# Patient Record
Sex: Female | Born: 1955 | Race: Black or African American | Hispanic: No | Smoking: Never smoker
Health system: Southern US, Community
[De-identification: ages and names within clinical notes are randomized; demographics above are authoritative.]

## PROBLEM LIST (undated history)

## (undated) DIAGNOSIS — S82899A Other fracture of unspecified lower leg, initial encounter for closed fracture: Secondary | ICD-10-CM

## (undated) HISTORY — PX: ABSCESS DRAINAGE: SHX1119

---

## 2003-10-24 ENCOUNTER — Other Ambulatory Visit: Admission: RE | Admit: 2003-10-24 | Discharge: 2003-10-24 | Payer: Self-pay | Admitting: Obstetrics and Gynecology

## 2004-10-24 ENCOUNTER — Other Ambulatory Visit: Admission: RE | Admit: 2004-10-24 | Discharge: 2004-10-24 | Payer: Self-pay | Admitting: Obstetrics and Gynecology

## 2004-11-19 ENCOUNTER — Other Ambulatory Visit (HOSPITAL_COMMUNITY): Admission: RE | Admit: 2004-11-19 | Discharge: 2005-02-17 | Payer: Self-pay | Admitting: Psychiatry

## 2004-11-19 ENCOUNTER — Ambulatory Visit: Payer: Self-pay | Admitting: Psychiatry

## 2006-10-08 ENCOUNTER — Emergency Department (HOSPITAL_COMMUNITY): Admission: EM | Admit: 2006-10-08 | Discharge: 2006-10-08 | Payer: Self-pay | Admitting: Family Medicine

## 2018-09-26 ENCOUNTER — Encounter (HOSPITAL_COMMUNITY): Payer: Self-pay

## 2018-09-26 ENCOUNTER — Encounter (HOSPITAL_COMMUNITY): Payer: Self-pay | Admitting: Emergency Medicine

## 2018-09-26 ENCOUNTER — Ambulatory Visit (INDEPENDENT_AMBULATORY_CARE_PROVIDER_SITE_OTHER): Payer: BLUE CROSS/BLUE SHIELD

## 2018-09-26 ENCOUNTER — Encounter (HOSPITAL_COMMUNITY)
Admission: AD | Disposition: A | Discharge status: Home / Self Care | Payer: Self-pay | Source: Ambulatory Visit | Attending: Orthopedic Surgery

## 2018-09-26 ENCOUNTER — Observation Stay (HOSPITAL_COMMUNITY)
Admission: AD | Admit: 2018-09-26 | Discharge: 2018-09-27 | Disposition: A | Discharge status: Home / Self Care | Payer: BLUE CROSS/BLUE SHIELD | Source: Ambulatory Visit | Attending: Orthopedic Surgery | Admitting: Orthopedic Surgery

## 2018-09-26 ENCOUNTER — Ambulatory Visit (HOSPITAL_COMMUNITY)
Admission: EM | Admit: 2018-09-26 | Discharge: 2018-09-26 | Disposition: A | Discharge status: Home / Self Care | Payer: BLUE CROSS/BLUE SHIELD | Source: Home / Self Care

## 2018-09-26 ENCOUNTER — Other Ambulatory Visit: Payer: Self-pay

## 2018-09-26 ENCOUNTER — Observation Stay (HOSPITAL_COMMUNITY): Payer: BLUE CROSS/BLUE SHIELD | Admitting: Anesthesiology

## 2018-09-26 ENCOUNTER — Observation Stay (HOSPITAL_COMMUNITY): Payer: BLUE CROSS/BLUE SHIELD

## 2018-09-26 DIAGNOSIS — S82842A Displaced bimalleolar fracture of left lower leg, initial encounter for closed fracture: Secondary | ICD-10-CM

## 2018-09-26 DIAGNOSIS — S99919A Unspecified injury of unspecified ankle, initial encounter: Secondary | ICD-10-CM | POA: Diagnosis present

## 2018-09-26 DIAGNOSIS — Y92003 Bedroom of unspecified non-institutional (private) residence as the place of occurrence of the external cause: Secondary | ICD-10-CM | POA: Diagnosis not present

## 2018-09-26 DIAGNOSIS — W01198A Fall on same level from slipping, tripping and stumbling with subsequent striking against other object, initial encounter: Secondary | ICD-10-CM | POA: Diagnosis not present

## 2018-09-26 DIAGNOSIS — S82843A Displaced bimalleolar fracture of unspecified lower leg, initial encounter for closed fracture: Secondary | ICD-10-CM

## 2018-09-26 DIAGNOSIS — Z1159 Encounter for screening for other viral diseases: Secondary | ICD-10-CM | POA: Diagnosis not present

## 2018-09-26 DIAGNOSIS — Z419 Encounter for procedure for purposes other than remedying health state, unspecified: Secondary | ICD-10-CM

## 2018-09-26 HISTORY — PX: ORIF ANKLE FRACTURE: SHX5408

## 2018-09-26 HISTORY — DX: Displaced bimalleolar fracture of unspecified lower leg, initial encounter for closed fracture: S82.843A

## 2018-09-26 HISTORY — DX: Other fracture of unspecified lower leg, initial encounter for closed fracture: S82.899A

## 2018-09-26 LAB — SURGICAL PCR SCREEN
MRSA, PCR: NEGATIVE
Staphylococcus aureus: NEGATIVE

## 2018-09-26 LAB — SARS CORONAVIRUS 2 BY RT PCR (HOSPITAL ORDER, PERFORMED IN ~~LOC~~ HOSPITAL LAB): SARS Coronavirus 2: NEGATIVE

## 2018-09-26 SURGERY — OPEN REDUCTION INTERNAL FIXATION (ORIF) ANKLE FRACTURE
Anesthesia: General | Site: Ankle | Laterality: Left

## 2018-09-26 MED ORDER — FENTANYL CITRATE (PF) 100 MCG/2ML IJ SOLN
INTRAMUSCULAR | Status: AC
  Administered 2018-09-26: 17:00:00 100 ug via INTRAVENOUS
  Filled 2018-09-26: qty 2

## 2018-09-26 MED ORDER — ONDANSETRON HCL 4 MG PO TABS: 4.0000 mg | ORAL_TABLET | Freq: Four times a day (QID) | ORAL | Status: DC | PRN

## 2018-09-26 MED ORDER — MIDAZOLAM HCL 2 MG/2ML IJ SOLN
INTRAMUSCULAR | Status: AC
  Administered 2018-09-26: 17:00:00 2 mg via INTRAVENOUS
  Filled 2018-09-26: qty 2

## 2018-09-26 MED ORDER — METHOCARBAMOL 1000 MG/10ML IJ SOLN
500.0000 mg | Freq: Four times a day (QID) | INTRAVENOUS | Status: DC | PRN
  Filled 2018-09-26: qty 5

## 2018-09-26 MED ORDER — METOCLOPRAMIDE HCL 5 MG PO TABS: 5.0000 mg | ORAL_TABLET | Freq: Three times a day (TID) | ORAL | Status: DC | PRN

## 2018-09-26 MED ORDER — FENTANYL CITRATE (PF) 250 MCG/5ML IJ SOLN
INTRAMUSCULAR | Status: AC
  Filled 2018-09-26: qty 5

## 2018-09-26 MED ORDER — METHOCARBAMOL 500 MG PO TABS
500.0000 mg | ORAL_TABLET | Freq: Four times a day (QID) | ORAL | Status: DC | PRN
  Administered 2018-09-26: 20:00:00 500 mg via ORAL

## 2018-09-26 MED ORDER — OXYCODONE HCL 5 MG PO TABS
5.0000 mg | ORAL_TABLET | ORAL | Status: DC | PRN
  Administered 2018-09-26: 23:00:00 10 mg via ORAL
  Filled 2018-09-26: qty 2

## 2018-09-26 MED ORDER — FENTANYL CITRATE (PF) 100 MCG/2ML IJ SOLN
INTRAMUSCULAR | Status: AC
  Administered 2018-09-26: 20:00:00 25 ug via INTRAVENOUS
  Filled 2018-09-26: qty 2

## 2018-09-26 MED ORDER — 0.9 % SODIUM CHLORIDE (POUR BTL) OPTIME
TOPICAL | Status: DC | PRN
  Administered 2018-09-26: 18:00:00 1000 mL

## 2018-09-26 MED ORDER — CEFAZOLIN SODIUM-DEXTROSE 2-4 GM/100ML-% IV SOLN
2.0000 g | INTRAVENOUS | Status: AC
  Administered 2018-09-26: 2 g via INTRAVENOUS
  Filled 2018-09-26 (×2): qty 100

## 2018-09-26 MED ORDER — LIDOCAINE 2% (20 MG/ML) 5 ML SYRINGE
INTRAMUSCULAR | Status: DC | PRN
  Administered 2018-09-26: 100 mg via INTRAVENOUS

## 2018-09-26 MED ORDER — ONDANSETRON HCL 4 MG/2ML IJ SOLN
INTRAMUSCULAR | Status: DC | PRN
  Administered 2018-09-26: 4 mg via INTRAVENOUS

## 2018-09-26 MED ORDER — CEFAZOLIN SODIUM-DEXTROSE 2-4 GM/100ML-% IV SOLN
2.0000 g | Freq: Four times a day (QID) | INTRAVENOUS | Status: AC
  Administered 2018-09-26 – 2018-09-27 (×2): 2 g via INTRAVENOUS
  Filled 2018-09-26 (×2): qty 100

## 2018-09-26 MED ORDER — LACTATED RINGERS IV SOLN
INTRAVENOUS | Status: AC
  Administered 2018-09-26: 22:00:00 via INTRAVENOUS

## 2018-09-26 MED ORDER — MEPERIDINE HCL 25 MG/ML IJ SOLN: 6.2500 mg | INTRAMUSCULAR | Status: DC | PRN

## 2018-09-26 MED ORDER — METHOCARBAMOL 500 MG PO TABS
ORAL_TABLET | ORAL | Status: AC
  Administered 2018-09-26: 20:00:00 500 mg via ORAL
  Filled 2018-09-26: qty 1

## 2018-09-26 MED ORDER — PROPOFOL 10 MG/ML IV BOLUS
INTRAVENOUS | Status: AC
  Filled 2018-09-26: qty 20

## 2018-09-26 MED ORDER — ASPIRIN 81 MG PO CHEW
81.0000 mg | CHEWABLE_TABLET | Freq: Two times a day (BID) | ORAL | Status: DC
  Administered 2018-09-27: 09:00:00 81 mg via ORAL
  Filled 2018-09-26: qty 1

## 2018-09-26 MED ORDER — PROMETHAZINE HCL 25 MG/ML IJ SOLN: 6.2500 mg | INTRAMUSCULAR | Status: DC | PRN

## 2018-09-26 MED ORDER — DOCUSATE SODIUM 100 MG PO CAPS
100.0000 mg | ORAL_CAPSULE | Freq: Two times a day (BID) | ORAL | Status: DC
  Administered 2018-09-26 – 2018-09-27 (×2): 100 mg via ORAL
  Filled 2018-09-26 (×2): qty 1

## 2018-09-26 MED ORDER — ADULT MULTIVITAMIN W/MINERALS CH
1.0000 | ORAL_TABLET | Freq: Every day | ORAL | Status: DC
  Administered 2018-09-27: 09:00:00 1 via ORAL
  Filled 2018-09-26: qty 1

## 2018-09-26 MED ORDER — FENTANYL CITRATE (PF) 100 MCG/2ML IJ SOLN
25.0000 ug | INTRAMUSCULAR | Status: DC | PRN
  Administered 2018-09-26 (×2): 25 ug via INTRAVENOUS

## 2018-09-26 MED ORDER — ONDANSETRON HCL 4 MG/2ML IJ SOLN: 4.0000 mg | Freq: Four times a day (QID) | INTRAMUSCULAR | Status: DC | PRN

## 2018-09-26 MED ORDER — MIDAZOLAM HCL 2 MG/2ML IJ SOLN
INTRAMUSCULAR | Status: AC
  Filled 2018-09-26: qty 2

## 2018-09-26 MED ORDER — PROPOFOL 10 MG/ML IV BOLUS
INTRAVENOUS | Status: DC | PRN
  Administered 2018-09-26: 150 mg via INTRAVENOUS

## 2018-09-26 MED ORDER — ACETAMINOPHEN 325 MG PO TABS
325.0000 mg | ORAL_TABLET | Freq: Four times a day (QID) | ORAL | Status: DC | PRN
  Administered 2018-09-26: 23:00:00 650 mg via ORAL
  Filled 2018-09-26: qty 2

## 2018-09-26 MED ORDER — POVIDONE-IODINE 10 % EX SWAB: 2.0000 "application " | Freq: Once | CUTANEOUS | Status: DC

## 2018-09-26 MED ORDER — FENTANYL CITRATE (PF) 100 MCG/2ML IJ SOLN
100.0000 ug | Freq: Once | INTRAMUSCULAR | Status: AC
  Administered 2018-09-26: 17:00:00 100 ug via INTRAVENOUS

## 2018-09-26 MED ORDER — FENTANYL CITRATE (PF) 100 MCG/2ML IJ SOLN
INTRAMUSCULAR | Status: DC | PRN
  Administered 2018-09-26: 25 ug via INTRAVENOUS

## 2018-09-26 MED ORDER — METOCLOPRAMIDE HCL 5 MG/ML IJ SOLN: 5.0000 mg | Freq: Three times a day (TID) | INTRAMUSCULAR | Status: DC | PRN

## 2018-09-26 MED ORDER — HYDROMORPHONE HCL 1 MG/ML IJ SOLN: 0.5000 mg | INTRAMUSCULAR | Status: DC | PRN

## 2018-09-26 MED ORDER — SODIUM CHLORIDE 0.9 % IV SOLN
INTRAVENOUS | Status: DC | PRN
  Administered 2018-09-26: 25 ug/min via INTRAVENOUS

## 2018-09-26 MED ORDER — DEXAMETHASONE SODIUM PHOSPHATE 10 MG/ML IJ SOLN
INTRAMUSCULAR | Status: DC | PRN
  Administered 2018-09-26: 10 mg via INTRAVENOUS

## 2018-09-26 MED ORDER — CHLORHEXIDINE GLUCONATE 4 % EX LIQD: 60.0000 mL | Freq: Once | CUTANEOUS | Status: DC

## 2018-09-26 MED ORDER — LACTATED RINGERS IV SOLN
INTRAVENOUS | Status: DC
  Administered 2018-09-26 (×2): via INTRAVENOUS

## 2018-09-26 MED ORDER — MIDAZOLAM HCL 2 MG/2ML IJ SOLN
2.0000 mg | Freq: Once | INTRAMUSCULAR | Status: AC
  Administered 2018-09-26: 17:00:00 2 mg via INTRAVENOUS

## 2018-09-26 MED ORDER — PHENYLEPHRINE HCL (PRESSORS) 10 MG/ML IV SOLN
INTRAVENOUS | Status: DC | PRN
  Administered 2018-09-26 (×2): 40 ug via INTRAVENOUS

## 2018-09-26 MED ORDER — GABAPENTIN 300 MG PO CAPS
300.0000 mg | ORAL_CAPSULE | Freq: Three times a day (TID) | ORAL | Status: DC
  Administered 2018-09-26 – 2018-09-27 (×2): 300 mg via ORAL
  Filled 2018-09-26 (×2): qty 1

## 2018-09-26 SURGICAL SUPPLY — 89 items
BANDAGE ACE 4X5 VEL STRL LF (GAUZE/BANDAGES/DRESSINGS) ×2 IMPLANT
BANDAGE ELASTIC 4 VELCRO ST LF (GAUZE/BANDAGES/DRESSINGS) ×1 IMPLANT
BIT DRILL 2.7 QC CANN 155 (BIT) ×1 IMPLANT
BIT DRILL OVR 3.5AO QC SHRT SM (DRILL) IMPLANT
BIT DRILL QC 2.0 SHORT EVOS SM (DRILL) IMPLANT
BIT DRILL QC 2.5MM SHRT EVO SM (DRILL) IMPLANT
BLADE SURG 10 STRL SS (BLADE) IMPLANT
BLADE SURG 15 STRL LF DISP TIS (BLADE) IMPLANT
BLADE SURG 15 STRL SS (BLADE) ×2
BNDG CMPR 9X4 STRL LF SNTH (GAUZE/BANDAGES/DRESSINGS) ×1
BNDG CMPR MED 10X6 ELC LF (GAUZE/BANDAGES/DRESSINGS) ×1
BNDG COHESIVE 6X5 TAN STRL LF (GAUZE/BANDAGES/DRESSINGS) IMPLANT
BNDG ELASTIC 6X10 VLCR STRL LF (GAUZE/BANDAGES/DRESSINGS) ×2 IMPLANT
BNDG ESMARK 4X9 LF (GAUZE/BANDAGES/DRESSINGS) ×2 IMPLANT
BNDG GAUZE ELAST 4 BULKY (GAUZE/BANDAGES/DRESSINGS) ×2 IMPLANT
COVER MAYO STAND STRL (DRAPES) IMPLANT
COVER SURGICAL LIGHT HANDLE (MISCELLANEOUS) ×2 IMPLANT
COVER WAND RF STERILE (DRAPES) ×2 IMPLANT
CUFF TOURNIQUET SINGLE 34IN LL (TOURNIQUET CUFF) IMPLANT
CUFF TOURNIQUET SINGLE 44IN (TOURNIQUET CUFF) IMPLANT
DRAPE C-ARM 42X72 X-RAY (DRAPES) ×2 IMPLANT
DRAPE INCISE IOBAN 66X45 STRL (DRAPES) IMPLANT
DRAPE STERI IOBAN 125X83 (DRAPES) ×1 IMPLANT
DRAPE SURG 17X23 STRL (DRAPES) ×2 IMPLANT
DRAPE U-SHAPE 47X51 STRL (DRAPES) ×2 IMPLANT
DRESSING AQUACEL AG SP 3.5X10 (GAUZE/BANDAGES/DRESSINGS) IMPLANT
DRILL OVER 3.5 AO QC SHORT SM (DRILL) ×2
DRILL QC 2.0 SHORT EVOS SM (DRILL) ×2
DRILL QC 2.5MM SHORT EVOS SM (DRILL) ×2
DRSG AQUACEL AG ADV 3.5X 6 (GAUZE/BANDAGES/DRESSINGS) ×1 IMPLANT
DRSG AQUACEL AG SP 3.5X10 (GAUZE/BANDAGES/DRESSINGS) ×2
DRSG PAD ABDOMINAL 8X10 ST (GAUZE/BANDAGES/DRESSINGS) ×2 IMPLANT
DURAPREP 26ML APPLICATOR (WOUND CARE) IMPLANT
ELECT CAUTERY BLADE 6.4 (BLADE) ×2 IMPLANT
ELECT REM PT RETURN 9FT ADLT (ELECTROSURGICAL) ×2
ELECTRODE REM PT RTRN 9FT ADLT (ELECTROSURGICAL) ×1 IMPLANT
GAUZE SPONGE 4X4 12PLY STRL (GAUZE/BANDAGES/DRESSINGS) ×2 IMPLANT
GAUZE XEROFORM 5X9 LF (GAUZE/BANDAGES/DRESSINGS) ×2 IMPLANT
GLOVE BIOGEL PI IND STRL 7.5 (GLOVE) ×1 IMPLANT
GLOVE BIOGEL PI IND STRL 8 (GLOVE) ×1 IMPLANT
GLOVE BIOGEL PI INDICATOR 7.5 (GLOVE) ×1
GLOVE BIOGEL PI INDICATOR 8 (GLOVE) ×1
GLOVE ECLIPSE 7.0 STRL STRAW (GLOVE) ×2 IMPLANT
GLOVE SURG ORTHO 8.0 STRL STRW (GLOVE) ×2 IMPLANT
GOWN STRL REUS W/ TWL LRG LVL3 (GOWN DISPOSABLE) ×3 IMPLANT
GOWN STRL REUS W/TWL LRG LVL3 (GOWN DISPOSABLE) ×6
GUIDE PIN 1.3 (PIN) ×4
HANDPIECE INTERPULSE COAX TIP (DISPOSABLE)
KIT BASIN OR (CUSTOM PROCEDURE TRAY) ×2 IMPLANT
KIT TURNOVER KIT B (KITS) ×2 IMPLANT
MANIFOLD NEPTUNE II (INSTRUMENTS) ×2 IMPLANT
NDL HYPO 25GX1X1/2 BEV (NEEDLE) ×1 IMPLANT
NEEDLE HYPO 25GX1X1/2 BEV (NEEDLE) ×2 IMPLANT
NS IRRIG 1000ML POUR BTL (IV SOLUTION) ×2 IMPLANT
PACK ORTHO EXTREMITY (CUSTOM PROCEDURE TRAY) ×2 IMPLANT
PAD ABD 8X10 STRL (GAUZE/BANDAGES/DRESSINGS) ×1 IMPLANT
PAD ARMBOARD 7.5X6 YLW CONV (MISCELLANEOUS) ×4 IMPLANT
PAD CAST 4YDX4 CTTN HI CHSV (CAST SUPPLIES) ×2 IMPLANT
PADDING CAST COTTON 4X4 STRL (CAST SUPPLIES) ×2
PADDING CAST COTTON 6X4 STRL (CAST SUPPLIES) ×1 IMPLANT
PIN GUIDE 1.3 (PIN) IMPLANT
PLATE 5H L 81MM FIBULA EVOS (Plate) ×1 IMPLANT
SCREW CANNULATED 4.1X40 (Screw) ×2 IMPLANT
SCREW CORT 2.7X10 STAR T8 EVOS (Screw) ×2 IMPLANT
SCREW CORT 2.7X12 EVOS (Screw) IMPLANT
SCREW CORT 2.7X14 T8 EVOS (Screw) ×3 IMPLANT
SCREW CORT 3.5X10MM ST EVOS (Screw) ×2 IMPLANT
SCREW CORT 3.5X14 ST EVOS (Screw) ×1 IMPLANT
SCREW CORT 3.5X22 ST EVOS (Screw) ×1 IMPLANT
SCREW CORT EVOS ST 3.5X12 (Screw) ×1 IMPLANT
SCREW CORT EVOS ST T8 2.7X14MM (Screw) ×4 IMPLANT
SCREW CORTEX 3.5X24MM (Screw) ×1 IMPLANT
SCREW LOCK ST EVOS 3.5X12 (Screw) ×1 IMPLANT
SET HNDPC FAN SPRY TIP SCT (DISPOSABLE) IMPLANT
STOCKINETTE IMPERVIOUS 9X36 MD (GAUZE/BANDAGES/DRESSINGS) IMPLANT
STOCKINETTE IMPERVIOUS LG (DRAPES) ×1 IMPLANT
SUCTION FRAZIER HANDLE 10FR (MISCELLANEOUS) ×1
SUCTION TUBE FRAZIER 10FR DISP (MISCELLANEOUS) ×1 IMPLANT
SUT ETHILON 3 0 PS 1 (SUTURE) ×3 IMPLANT
SUT VIC AB 2-0 CTB1 (SUTURE) ×3 IMPLANT
SUT VIC AB 2-0 FS1 27 (SUTURE) ×1 IMPLANT
SUT VIC AB 3-0 SH 27 (SUTURE) ×2
SUT VIC AB 3-0 SH 27X BRD (SUTURE) ×1 IMPLANT
SYR CONTROL 10ML LL (SYRINGE) ×2 IMPLANT
TOWEL OR 17X24 6PK STRL BLUE (TOWEL DISPOSABLE) ×2 IMPLANT
TOWEL OR 17X26 10 PK STRL BLUE (TOWEL DISPOSABLE) ×2 IMPLANT
TUBE CONNECTING 12X1/4 (SUCTIONS) ×2 IMPLANT
WATER STERILE IRR 1000ML POUR (IV SOLUTION) ×2 IMPLANT
YANKAUER SUCT BULB TIP NO VENT (SUCTIONS) IMPLANT

## 2018-09-26 NOTE — Transfer of Care (Signed)
Immediate Anesthesia Transfer of Care Note  Patient: Stacey Oconnor  Procedure(s) Performed: OPEN REDUCTION INTERNAL FIXATION (ORIF) ANKLE FRACTURE (Left Ankle)  Patient Location: PACU  Anesthesia Type:General  Level of Consciousness: awake and alert   Airway & Oxygen Therapy: Patient Spontanous Breathing and Patient connected to nasal cannula oxygen  Post-op Assessment: Report given to RN and Post -op Vital signs reviewed and stable  Post vital signs: Reviewed and stable  Last Vitals:  Vitals Value Taken Time  BP    Temp    Pulse 90 09/26/2018  7:53 PM  Resp 21 09/26/2018  7:53 PM  SpO2 100 % 09/26/2018  7:53 PM  Vitals shown include unvalidated device data.  Last Pain:  Vitals:   09/26/18 1720  PainSc: 0-No pain      Patients Stated Pain Goal: 4 (09/26/18 1647)  Complications: No apparent anesthesia complications

## 2018-09-26 NOTE — Anesthesia Procedure Notes (Signed)
Anesthesia Regional Block: Popliteal block   Pre-Anesthetic Checklist: ,, timeout performed, Correct Patient, Correct Site, Correct Laterality, Correct Procedure, Correct Position, site marked, Risks and benefits discussed,  Surgical consent,  Pre-op evaluation,  At surgeon's request and post-op pain management  Laterality: Left  Prep: chloraprep       Needles:  Injection technique: Single-shot  Needle Type: Stimiplex     Needle Length: 10cm  Needle Gauge: 21     Additional Needles:   Procedures:,,,, ultrasound used (permanent image in chart),,,,  Motor weakness within 5 minutes.  Narrative:  Start time: 09/26/2018 5:25 PM End time: 09/26/2018 5:31 PM Injection made incrementally with aspirations every 5 mL.  Performed by: Personally  Anesthesiologist: Lewie Loron, MD  Additional Notes: Nerve located and needle positioned with direct ultrasound guidance. Good perineural spread. Patient tolerated well.

## 2018-09-26 NOTE — Anesthesia Procedure Notes (Signed)
Procedure Name: LMA Insertion Date/Time: 09/26/2018 6:12 PM Performed by: Edmonia Caprio, CRNA Pre-anesthesia Checklist: Patient identified, Emergency Drugs available, Suction available, Patient being monitored and Timeout performed Patient Re-evaluated:Patient Re-evaluated prior to induction Oxygen Delivery Method: Circle system utilized Preoxygenation: Pre-oxygenation with 100% oxygen Induction Type: IV induction LMA: LMA inserted LMA Size: 4.0 Tube type: Oral Number of attempts: 1 Placement Confirmation: breath sounds checked- equal and bilateral and positive ETCO2 Tube secured with: Tape

## 2018-09-26 NOTE — Anesthesia Postprocedure Evaluation (Signed)
Anesthesia Post Note  Patient: Stacey Oconnor  Procedure(s) Performed: OPEN REDUCTION INTERNAL FIXATION (ORIF) ANKLE FRACTURE (Left Ankle)     Patient location during evaluation: PACU Anesthesia Type: General Level of consciousness: sedated and patient cooperative Pain management: pain level controlled Vital Signs Assessment: post-procedure vital signs reviewed and stable Respiratory status: spontaneous breathing Cardiovascular status: stable Anesthetic complications: no    Last Vitals:  Vitals:   09/26/18 2039 09/26/18 2057  BP: 134/83 (!) 137/92  Pulse: 84 83  Resp: 15   Temp: (!) 36.3 C 37.2 C  SpO2: 97% 99%    Last Pain:  Vitals:   09/26/18 2057  TempSrc: Oral  PainSc:                  Lewie Loron

## 2018-09-26 NOTE — ED Provider Notes (Signed)
[facility]    CSN: [id number] Arrival date & time: 09/26/18  1037     History   Chief Complaint Chief Complaint  Patient presents with  . Ankle Injury    HPI Stacey Oconnor is a 63 y.o. female.   Stacey Oconnor presents with complaints of persistent left ankle pain and swelling. Stacey Oconnor woke to go to the restroom over night 5/24, her foot caught in her rug causing her to trip and fall, landing on her ankle. Pain ever since. Has improved some. Tingling and numbness intermittently. Tingling feels worse at night. Has not been able to bear weight. Pain with movement and with touch. Has been soaking it, icy it and taking tylenol which minimally helps with pain. Denies any previous injury to the ankle or foot.    ROS per HPI, negative if not otherwise mentioned.      History reviewed. No pertinent past medical history.  There are no active problems to display for this patient.   History reviewed. No pertinent surgical history.  OB History   No obstetric history on file.      Home Medications    Prior to Admission medications   Not on File    Family History No family history on file.  Social History Social History   Tobacco Use  . Smoking status: Not on file  Substance Use Topics  . Alcohol use: Not on file  . Drug use: Not on file     Allergies   Patient has no allergy information on record.   Review of Systems Review of Systems   Physical Exam Triage Vital Signs ED Triage Vitals  Enc Vitals Group     BP 09/26/18 1154 (!) 161/96     Pulse Rate 09/26/18 1154 100     Resp --      Temp 09/26/18 1154 98.6 F (37 C)     Temp Source 09/26/18 1154 Oral     SpO2 09/26/18 1154 100 %     Weight --      Height --      Head Circumference --      Peak Flow --      Pain Score 09/26/18 1156 8     Pain Loc --      Pain Edu? --      Excl. in GC? --    No data found.  Updated Vital Signs BP (!) 161/96 (BP Location: Right Arm)   Pulse 100    Temp 98.6 F (37 C) (Oral)   SpO2 100%   Visual Acuity Right Eye Distance:   Left Eye Distance:   Bilateral Distance:    Right Eye Near:   Left Eye Near:    Bilateral Near:     Physical Exam Constitutional:      General: Stacey Oconnor is not in acute distress.    Appearance: Stacey Oconnor is well-developed.  Cardiovascular:     Rate and Rhythm: Normal rate and regular rhythm.     Heart sounds: Normal heart sounds.  Pulmonary:     Effort: Pulmonary effort is normal.     Breath sounds: Normal breath sounds.  Musculoskeletal:     Left ankle: Stacey Oconnor exhibits decreased range of motion, swelling and ecchymosis. Stacey Oconnor exhibits no deformity, no laceration and normal pulse. Tenderness.     Comments: Gross swelling, bruising and tenderness to left ankle and foot; faint pedal pulses but equal bilaterally; cap refill < 2 seconds ; patient able to slightly dorsiflex and flex  ankle but with pain; moving toes; gross sensation intact; cap refill < 2 seconds    Skin:    General: Skin is warm and dry.  Neurological:     Mental Status: Stacey Oconnor is alert and oriented to person, place, and time.      UC Treatments / Results  Labs (all labs ordered are listed, but only abnormal results are displayed) Labs Reviewed  SARS CORONAVIRUS 2 (HOSPITAL ORDER, PERFORMED IN [facility])    EKG None  Radiology Dg Ankle Complete Left  Result Date: 09/26/2018 CLINICAL DATA:  Fall 5 days ago EXAM: LEFT ANKLE COMPLETE - 3+ VIEW COMPARISON:  None. FINDINGS: There is a complex fracture involving the ankle. There is a fracture through the medial malleolus at the level of the tibial plafond. There is a fracture of the distal fibula extending from the tibial plafond superiorly and posteriorly. There is marked medial displacement of the distal tibia with respect to the talus. Soft tissue swelling is noted. IMPRESSION: Bimalleolar fracture with displacement of the ankle mortise as described. Electronically Signed   By: Jolaine Click M.D.   On: 09/26/2018 12:14    Procedures Procedures (including critical care time)  Medications Ordered in UC Medications - No data to display  Initial Impression / Assessment and Plan / UC Course  I have reviewed the triage vital signs and the nursing notes.  Pertinent labs & imaging results that were available during my care of the patient were reviewed by me and considered in my medical decision making (see chart for details).    Complex bimalleolar fracture with displacement. Spoke with Dr. August Saucer with ortho, photos provided. Hasn't eaten since 5p last night, water last at 0900 this am. To put prefab splint in place and directly admit to 6N, remaining NPO for surgery today or tomorrow. Patient notified.   covid screening completed and patient transported to admissions.  Final Clinical Impressions(s) / UC Diagnoses   Final diagnoses:  Bimalleolar ankle fracture, left, closed, initial encounter     Discharge Instructions     To be direct admit under Dr. August Saucer to 6 north  DO NOT EAT    ED Prescriptions    None     Controlled Substance Prescriptions [place] Controlled Substance Registry consulted? Not Applicable   Georgetta Haber, NP 09/26/18 1343

## 2018-09-26 NOTE — Op Note (Signed)
NAME: Stacey Oconnor, Stacey Oconnor MEDICAL RECORD [id number] ACCOUNT [id number] DATE OF BIRTH:June 23, 1955 FACILITY: [facility] LOCATION: [facility] PH[facility]ICIAN:Neziah Vogelgesang Diamantina Providence, MD  OPERATIVE REPORT  DATE OF PROCEDURE:  09/26/2018  PREOPERATIVE DIAGNOSIS:  Left bimalleolar ankle fracture.  POSTOPERATIVE DIAGNOSIS:  Left bimalleolar ankle fracture.  PROCEDURE:  Left bimalleolar ankle fracture open reduction internal fixation.  SURGEON:  Cammy Copa, MD  ASSISTANT:  April Green, RNFA  INDICATIONS:  This is a 63 year old patient with a 44-day-old left ankle fracture who presents with bimalleolar ankle fracture for operative management.  PROCEDURE IN DETAIL:  The patient was brought to the operating room where general anesthetic was induced.  Preoperative antibiotics were administered.  Timeout was called.  Left ankle was prescrubbed with alcohol and Betadine and allowed to air dry,  prepped with DuraPrep solution and draped in a sterile manner.  Ioban used to cover the operative field.  An ankle Esmarch was utilized for approximately an hour and 10 minutes.  A lateral incision was made over the lateral malleolus.  Skin and  subcutaneous tissue were sharply divided.  Periosteal flaps full-thickness, were elevated on either side of the fracture.  Care was taken to avoid injury to superficial peroneal nerve.  The fracture was identified and reduced, held in position, and a lag  screw was placed proximally anterior to posterior distal.  A Smith and Nephew 5-hole plate was then applied with nonlocking screws proximally and locking screws distally.  Attempt was made to reposition that lag screw to get it more central within the  fibula; however, that was not successful.  Stable fixation, nonetheless, was present and, thus, the lag screw was removed.  Following this, attention was directed towards the medial side.  Skin and subcutaneous tissue were sharply divided over that  medial fracture.  A 5 cm incision was  made.  The fracture was identified.  Saphenous vein and nerve were identified and protected.  The fracture was then reduced and held with a dental pick.  Two 40 mm cannulated 4.0 cancellous screws were placed, and  the ankle fracture was well reduced.  Syndesmosis was tested and found to be stable.  Ankle Esmarch released.  Thorough irrigation performed.  Both incisions were then closed using interrupted inverted 2-0 and 3-0 Vicryl and 3-0 nylon.  An Aquacel  dressing along with a well-padded posterior splint were applied.  The patient tolerated the procedure well without immediate complication and transferred to the recovery room in stable condition.  LN/NUANCE  D:09/26/2018 T:09/26/2018 JOB:[id number]

## 2018-09-26 NOTE — ED Triage Notes (Signed)
Per pt she fell on Monday and twisted her left ankle. obvious swelling and bruising. Pt IS NOT ABLE TO WALK OR PUT PRESSURE ON IT

## 2018-09-26 NOTE — Brief Op Note (Signed)
   09/26/2018  7:45 PM  PATIENT:  Minerva Ends  63 y.o. female  PRE-OPERATIVE DIAGNOSIS:  left ankel fracture  POST-OPERATIVE DIAGNOSIS:  left ankel fracture  PROCEDURE:  Procedure(s): OPEN REDUCTION INTERNAL FIXATION (ORIF) ANKLE FRACTURE  SURGEON:  Surgeon(s): August Saucer, Corrie Mckusick, MD  ASSISTANT: Chilton Si RNFA  ANESTHESIA:   general  EBL: 25 ml    Total I/O In: 900 [I.V.:900] Out: 25 [Blood:25]  BLOOD ADMINISTERED: none  DRAINS: none   LOCAL MEDICATIONS USED:  none  SPECIMEN:  No Specimen  COUNTS:  YES  TOURNIQUET:  * Missing tourniquet times found for documented tourniquets in log: [id number] *  DICTATION: .Other Dictation: Dictation Number [id number]  PLAN OF CARE: Admit for overnight observation  PATIENT DISPOSITION:  PACU - hemodynamically stable

## 2018-09-26 NOTE — Anesthesia Procedure Notes (Signed)
Anesthesia Regional Block: Adductor canal block   Pre-Anesthetic Checklist: ,, timeout performed, Correct Patient, Correct Site, Correct Laterality, Correct Procedure, Correct Position, site marked, Risks and benefits discussed,  Surgical consent,  Pre-op evaluation,  At surgeon's request and post-op pain management  Laterality: Left  Prep: chloraprep       Needles:  Injection technique: Single-shot  Needle Type: Stimiplex     Needle Length: 9cm  Needle Gauge: 21     Additional Needles:   Procedures:,,,, ultrasound used (permanent image in chart),,,,  Narrative:  Start time: 09/26/2018 5:22 PM End time: 09/26/2018 5:24 PM Injection made incrementally with aspirations every 5 mL.  Performed by: Personally  Anesthesiologist: Lewie Loron, MD  Additional Notes: BP cuff, EKG monitors applied. Sedation begun. Artery and nerve location verified with U/S and anesthetic injected incrementally, slowly, and after negative aspirations under direct u/s guidance. Good fascial /perineural spread. Tolerated well.

## 2018-09-26 NOTE — Anesthesia Preprocedure Evaluation (Signed)
Anesthesia Evaluation  Patient identified by MRN, date of birth, ID band Patient awake    Reviewed: Allergy & Precautions, NPO status , Patient's Chart, lab work & pertinent test results  Airway Mallampati: II  TM Distance: >3 FB Neck ROM: Full    Dental  (+) Dental Advisory Given   Pulmonary neg pulmonary ROS,    Pulmonary exam normal breath sounds clear to auscultation       Cardiovascular negative cardio ROS Normal cardiovascular exam Rhythm:Regular Rate:Normal     Neuro/Psych negative neurological ROS  negative psych ROS   GI/Hepatic negative GI ROS, Neg liver ROS,   Endo/Other  negative endocrine ROS  Renal/GU negative Renal ROS     Musculoskeletal negative musculoskeletal ROS (+)   Abdominal   Peds  Hematology negative hematology ROS (+)   Anesthesia Other Findings   Reproductive/Obstetrics negative OB ROS                             Anesthesia Physical Anesthesia Plan  ASA: I  Anesthesia Plan: General   Post-op Pain Management: GA combined w/ Regional for post-op pain   Induction: Intravenous  PONV Risk Score and Plan: 3 and Ondansetron, Dexamethasone and Midazolam  Airway Management Planned: LMA  Additional Equipment: None  Intra-op Plan:   Post-operative Plan: Extubation in OR  Informed Consent: I have reviewed the patients History and Physical, chart, labs and discussed the procedure including the risks, benefits and alternatives for the proposed anesthesia with the patient or authorized representative who has indicated his/her understanding and acceptance.     Dental advisory given  Plan Discussed with: CRNA  Anesthesia Plan Comments:         Anesthesia Quick Evaluation

## 2018-09-26 NOTE — Progress Notes (Addendum)
Patient admitted to unit as a direct admit. BP 156/107 Patient settled in chair and oriented to unit and hospital routine. Pt resting comfortably in chair with call bell in reach. Instructed patient to utilize call bell for assistance. MD paged for orders. Will continue to monitor closely for remainder of shift.

## 2018-09-26 NOTE — H&P (Signed)
Stacey Oconnor is an 63 y.o. female.   Chief Complaint: Left ankle pain HPI: Stacey Oconnor is a 63 year old patient with left ankle pain.  She describes having a fall on Sunday but walking around the ankle and thinking it was just a sprain.  Today at the urgent care she was noted to have displaced bimalleolar ankle fracture.  Denies any other orthopedic complaints.  She works currently.  Presents now for further operative management  No past medical history on file.  No past surgical history on file.  No family history on file. Social History:  has no history on file for tobacco, alcohol, and drug.  Allergies: Not on File  No medications prior to admission.    Results for orders placed or performed during the hospital encounter of 09/26/18 (from the past 48 hour(s))  SARS Coronavirus 2 (CEPHEID - Performed in [facility] lab), Hosp Order     Status: None   Collection Time: 09/26/18  1:48 PM  Result Value Ref Range   SARS Coronavirus 2 NEGATIVE NEGATIVE    Comment: (NOTE) If result is NEGATIVE SARS-CoV-2 target nucleic acids are NOT DETECTED. The SARS-CoV-2 RNA is generally detectable in upper and lower  respiratory specimens during the acute phase of infection. The lowest  concentration of SARS-CoV-2 viral copies this assay can detect is 250  copies / mL. A negative result does not preclude SARS-CoV-2 infection  and should not be used as the sole basis for treatment or other  patient management decisions.  A negative result may occur with  improper specimen collection / handling, submission of specimen other  than nasopharyngeal swab, presence of viral mutation(s) within the  areas targeted by this assay, and inadequate number of viral copies  (<250 copies / mL). A negative result must be combined with clinical  observations, patient history, and epidemiological information. If result is POSITIVE SARS-CoV-2 target nucleic acids are DETECTED. The SARS-CoV-2 RNA is generally  detectable in upper and lower  respiratory specimens dur ing the acute phase of infection.  Positive  results are indicative of active infection with SARS-CoV-2.  Clinical  correlation with patient history and other diagnostic information is  necessary to determine patient infection status.  Positive results do  not rule out bacterial infection or co-infection with other viruses. If result is PRESUMPTIVE POSTIVE SARS-CoV-2 nucleic acids MAY BE PRESENT.   A presumptive positive result was obtained on the submitted specimen  and confirmed on repeat testing.  While 2019 novel coronavirus  (SARS-CoV-2) nucleic acids may be present in the submitted sample  additional confirmatory testing may be necessary for epidemiological  and / or clinical management purposes  to differentiate between  SARS-CoV-2 and other Sarbecovirus currently known to infect humans.  If clinically indicated additional testing with an alternate test  methodology [phone]([id number]) is advised. The SARS-CoV-2 RNA is generally  detectable in upper and lower respiratory sp ecimens during the acute  phase of infection. The expected result is Negative. Fact Sheet for Patients:  BoilerBrush.com.cyhttps://www.fda.gov/media/136312/download Fact Sheet for Healthcare Providers: https://pope.com/https://www.fda.gov/media/136313/download This test is not yet approved or cleared by the [place] FDA and has been authorized for detection and/or diagnosis of SARS-CoV-2 by FDA under an Emergency Use Authorization (EUA).  This EUA will remain in effect (meaning this test can be used) for the duration of the COVID-19 declaration under Section 564(b)(1) of the Act, 21 U.S.C. section 360bbb-3(b)(1), unless the authorization is terminated or revoked sooner. Performed at [facility], [address] [place], [place] [address]    Dg Ankle Complete Left  Result Date: 09/26/2018 CLINICAL DATA:  Fall 5 days ago EXAM: LEFT ANKLE COMPLETE - 3+ VIEW COMPARISON:  None.  FINDINGS: There is a complex fracture involving the ankle. There is a fracture through the medial malleolus at the level of the tibial plafond. There is a fracture of the distal fibula extending from the tibial plafond superiorly and posteriorly. There is marked medial displacement of the distal tibia with respect to the talus. Soft tissue swelling is noted. IMPRESSION: Bimalleolar fracture with displacement of the ankle mortise as described. Electronically Signed   By: Jolaine Click M.D.   On: 09/26/2018 12:14    Review of Systems  Musculoskeletal: Positive for joint pain.  All other systems reviewed and are negative.   There were no vitals taken for this visit. Physical Exam  Constitutional: She appears well-developed.  HENT:  Head: Normocephalic.  Eyes: Pupils are equal, round, and reactive to light.  Neck: Normal range of motion.  Cardiovascular: Normal rate.  Respiratory: Effort normal.  Neurological: She is alert.  Skin: Skin is warm.  Psychiatric: She has a normal mood and affect.  Left ankle demonstrates swelling but the compartments are soft.  No fracture blisters.  Pedal pulses palpable.  Sensation intact on the dorsal plantar aspect of the foot.  Assessment/Plan Impression is bimalleolar ankle fracture.  Plan is open reduction internal fixation.  Risk and benefits discussed including but limited to infection nerve vessel damage all questions answered potential need for further procedures and development of arthritis also discussed  Burnard Bunting, MD 09/26/2018, 3:38 PM

## 2018-09-26 NOTE — Discharge Instructions (Addendum)
To be direct admit under Dr. August Saucer to 6 north  DO NOT EAT

## 2018-09-27 DIAGNOSIS — S82842A Displaced bimalleolar fracture of left lower leg, initial encounter for closed fracture: Secondary | ICD-10-CM | POA: Diagnosis not present

## 2018-09-27 MED ORDER — ASPIRIN 81 MG PO CHEW: 81.0000 mg | CHEWABLE_TABLET | Freq: Two times a day (BID) | ORAL | 0 refills | Status: DC

## 2018-09-27 MED ORDER — METHOCARBAMOL 500 MG PO TABS: 500.0000 mg | ORAL_TABLET | Freq: Four times a day (QID) | ORAL | 0 refills | Status: DC | PRN

## 2018-09-27 MED ORDER — OXYCODONE HCL 5 MG PO TABS: 5.0000 mg | ORAL_TABLET | ORAL | 0 refills | Status: DC | PRN

## 2018-09-27 NOTE — Plan of Care (Signed)
  Problem: Clinical Measurements: Goal: Ability to maintain clinical measurements within normal limits will improve Outcome: Progressing  Problem: Coping: Goal: Level of anxiety will decrease Outcome: Progressing   Problem: Pain Managment: Goal: General experience of comfort will improve Outcome: Progressing   Problem: Safety: Goal: Ability to remain free from injury will improve Outcome: Progressing   Problem: Skin Integrity: Goal: Risk for impaired skin integrity will decrease Outcome: Progressing     

## 2018-09-27 NOTE — Progress Notes (Signed)
Provided discharge education/instructions, all questions and concerns addressed, Pt not in distress, discharged home with belongings. 

## 2018-09-27 NOTE — Evaluation (Signed)
Physical Therapy Evaluation Patient Details Name: Stacey Oconnor MRN: [id number] DOB: 07/04/1955 Today's Date: 09/27/2018   History of Present Illness  Pt fell and sustained L bimalleolar ankle fx, underwent ORIF 5/30.   Clinical Impression  Patient evaluated by Physical Therapy with no further acute PT needs identified. All education has been completed and the patient has no further questions. Pt safe with mobility and practiced stairs, Given LLE there ex for home.  See below for any follow-up Physical Therapy or equipment needs. PT is signing off. Thank you for this referral.     Follow Up Recommendations Outpatient PT when appropriate    Equipment Recommendations  Crutches    Recommendations for Other Services       Precautions / Restrictions Precautions Precautions: Fall Restrictions Weight Bearing Restrictions: Yes LLE Weight Bearing: Non weight bearing      Mobility  Bed Mobility Overal bed mobility: Modified Independent                Transfers Overall transfer level: Modified independent Equipment used: Crutches             General transfer comment: pt safe with transfers and has been performing at home with LLE NWB for several days  Ambulation/Gait Ambulation/Gait assistance: Supervision Gait Distance (Feet): 75 Feet Assistive device: Crutches(knee scooter) Gait Pattern/deviations: Step-to pattern Gait velocity: WFL for AD Gait velocity interpretation: >2.62 ft/sec, indicative of community ambulatory General Gait Details: let pt try knee scooter but it was uncomfortable where her cast hit the top of her shin. Pt safe with crutches and has been using a 4 wheel RW at home but does not work with steps  Stairs Stairs: Yes Stairs assistance: Min assist Stair Management: With crutches;Forwards Number of Stairs: 2 General stair comments: pt able to complete stairs effectively with crutches, boyfriend will be with her for safety  Wheelchair  Mobility    Modified Rankin (Stroke Patients Only)       Balance Overall balance assessment: No apparent balance deficits (not formally assessed)                                           Pertinent Vitals/Pain Pain Assessment: Faces Faces Pain Scale: Hurts even more Pain Location: L ankle Pain Descriptors / Indicators: Aching;Sore Pain Intervention(s): Limited activity within patient's tolerance;Monitored during session;Premedicated before session    [place] expects to be discharged to:: Private residence Living Arrangements: Spouse/significant other Available Help at Discharge: Family;Available 24 hours/day Type of Home: House Home Access: Stairs to enter Entrance Stairs-Rails: None Entrance Stairs-Number of Steps: 3 Home Layout: One level Home Equipment: None      Prior Function Level of Independence: Independent         Comments: 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     Hand Dominance   Dominant Hand: Right    Extremity/Trunk Assessment   Upper Extremity Assessment Upper Extremity Assessment: Overall WFL for tasks assessed    Lower Extremity Assessment Lower Extremity Assessment: LLE deficits/detail LLE Deficits / Details: hip WFL, knee WFL, ankle immobilized LLE: Unable to fully assess due to immobilization LLE Sensation: decreased light touch LLE Coordination: WNL    Cervical / Trunk Assessment Cervical / Trunk Assessment: Normal  Communication   Communication: No difficulties  Cognition Arousal/Alertness: Awake/alert Behavior During Therapy: WFL for tasks assessed/performed Overall Cognitive  Status: Within Functional Limits for tasks assessed                                        General Comments General comments (skin integrity, edema, etc.): discussed proper positioning and car transfers    Exercises General Exercises - Lower Extremity Quad Sets:  AROM;Both;10 reps;Seated Gluteal Sets: AROM;Both;10 reps;Seated Long Arc Quad: AROM;Left;10 reps;Seated Heel Slides: AROM;Left;10 reps;Seated Straight Leg Raises: AROM;Left;5 reps;Seated   Assessment/Plan    PT Assessment All further PT needs can be met in the next venue of care  PT Problem List Decreased strength;Decreased range of motion;Decreased activity tolerance;Decreased mobility;Decreased knowledge of use of DME;Decreased knowledge of precautions;Pain;Impaired sensation       PT Treatment Interventions      PT Goals (Current goals can be found in the Care Plan section)  Acute Rehab PT Goals Patient Stated Goal: return home PT Goal Formulation: All assessment and education complete, DC therapy    Frequency     Barriers to discharge        Co-evaluation               AM-PAC PT "6 Clicks" Mobility  Outcome Measure Help needed turning from your back to your side while in a flat bed without using bedrails?: None Help needed moving from lying on your back to sitting on the side of a flat bed without using bedrails?: None Help needed moving to and from a bed to a chair (including a wheelchair)?: A Little Help needed standing up from a chair using your arms (e.g., wheelchair or bedside chair)?: A Little Help needed to walk in hospital room?: A Little Help needed climbing 3-5 steps with a railing? : A Little 6 Click Score: 20    End of Session Equipment Utilized During Treatment: Gait belt Activity Tolerance: Patient tolerated treatment well Patient left: in chair;with call bell/phone within reach Nurse Communication: Mobility status PT Visit Diagnosis: Difficulty in walking, not elsewhere classified (R26.2);Pain;History of falling (Z91.81) Pain - Right/Left: Left Pain - part of body: Ankle and joints of foot    Time: 1112-1204 PT Time Calculation (min) (ACUTE ONLY): 52 min   Charges:   PT Evaluation $PT Eval Low Complexity: 1 Low PT Treatments $Gait  Training: 23-37 mins        Leighton Roach, PT  Acute Rehab Services  Pager [phone] Office [place] 09/27/2018, 1:13 PM

## 2018-09-27 NOTE — Discharge Instructions (Signed)
Keep short leg splint and dressing dry. May use water impervious bag or cast bag and tub chair to shower Tape the top of bag to skin to avoid moisture soaking the dressing on the leg. Call if there is odor or saturation of dressing or worsening pain not controlled with medications. Call if fever greater than 101.5. Use crutches or walker no weight bearing on the ankle fracture leg. Please follow up with an appointment with Dr. August Saucer in 10 days (10/07/2018) from the time of surgery. Elevate as often as possible during the first week after surgery gradually increasing the time the leg is dependent or down there after. If swelling recurrs then elevate again. Wheel chair for longer distances. Take baby aspirin 81 mg for anticoagulant twice   Cast or Splint Care, Adult Casts and splints are supports that are worn to protect broken bones and other injuries. A cast or splint may hold a bone still and in the correct position while it heals. Casts and splints may also help to ease pain, swelling, and muscle spasms. How to care for your cast   Do not stick anything inside the cast to scratch your skin.  Check the skin around the cast every day. Tell your doctor about any concerns.  You may put lotion on dry skin around the edges of the cast. Do not put lotion on the skin under the cast.  Keep the cast clean.  If the cast is not waterproof: ? Do not let it get wet. ? Cover it with a watertight covering when you take a bath or a shower. How to care for your splint   Wear it as told by your doctor. Take it off only as told by your doctor.  Loosen the splint if your fingers or toes tingle, get numb, or turn cold and blue.  Keep the splint clean.  If the splint is not waterproof: ? Do not let it get wet. ? Cover it with a watertight covering when you take a bath or a shower. Follow these instructions at home: Bathing  Do not take baths or swim until your doctor says it is okay. Ask  your doctor if you can take showers. You may only be allowed to take sponge baths for bathing.  If your cast or splint is not waterproof, cover it with a watertight covering when you take a bath or shower. Managing pain, stiffness, and swelling  Move your fingers or toes often to avoid stiffness and to lessen swelling.  Raise (elevate) the injured area above the level of your heart while sitting or lying down. Safety  Do not use the injured limb to support your body weight until your doctor says that it is okay.  Use crutches or other assistive devices as told by your doctor. General instructions  Do not put pressure on any part of the cast or splint until it is fully hardened. This may take many hours.  Return to your normal activities as told by your doctor. Ask your doctor what activities are safe for you.  Keep all follow-up visits as told by your doctor. This is important. Contact a doctor if:  Your cast or splint gets damaged.  The skin around the cast gets red or raw.  The skin under the cast is very itchy or painful.  Your cast or splint feels very uncomfortable.  Your cast or splint is too tight or too loose.  Your cast becomes wet or it starts to  have a soft spot or area.  You get an object stuck under your cast. Get help right away if:  Your pain gets worse.  The injured area tingles, gets numb, or turns blue and cold.  The part of your body above or below the cast is swollen and it turns a different color (is discolored).  You cannot feel or move your fingers or toes.  There is fluid leaking through the cast.  You have very bad pain or pressure under the cast.  You have trouble breathing.  You have shortness of breath.  You have chest pain. This information is not intended to replace advice given to you by your health care provider. Make sure you discuss any questions you have with your health care provider. Document Released: 08/15/2010 Document  Revised: 04/05/2016 Document Reviewed: 04/05/2016 Elsevier Interactive Patient Education  2019 [employer]   Ankle Fracture  The ankle joint is made up of the lower (distal) sections of your lower leg bones(tibia and fibula) along with a bone in your foot (talus). An ankle fracture is a break in one, two, or all three of these sections of bone. Follow these instructions at home: If you have a splint:  Wear the splint as told by your doctor. Take it off only as told by your doctor.  Loosen the splint if your toes tingle, become numb, or turn cold and blue.  Keep the splint clean.  If the splint is not waterproof: ? Do not let it get wet. ? Cover it with a watertight covering when you take a bath or a shower. If you have a cast:  Do not stick anything inside the cast to scratch your skin. Doing that increases your risk of infection.  Check the skin around the cast every day. Tell your doctor about any concerns.  You may put lotion on dry skin around the edges of the cast. Do not put lotion on the skin underneath the cast.  Keep the cast clean.  If the cast is not waterproof: ? Do not let it get wet. ? Cover it with a watertight covering when you take a bath or a shower. Managing pain, stiffness, and swelling  If directed, put ice on the injured area: ? If you have a removable splint, remove it as told by your doctor. ? Put ice in a plastic bag. ? Place a towel between your skin and the bag. ? Leave the ice on for 20 minutes, 2-3 times a day.  Move your toes often. This prevents stiffness and lessens swelling.  Raise (elevate) the injured area above the level of your heart while you are sitting or lying down. General instructions  Do not use the injured limb to support your body weight until your doctor says that you can. Use crutches as told by your doctor.  Take over-the-counter and prescription medicines only as told by your doctor.  Ask your doctor when it is safe  to drive if you have a cast or splint.  Do exercises as told by your doctor.  Do not use any products that contain nicotine or tobacco, such as cigarettes and e-cigarettes. These can delay bone healing. If you need help quitting, ask your doctor.  Keep all follow-up visits as told by your doctor. This is important. Contact a doctor if:  Your pain or swelling gets worse.  Your pain or swelling does not get better when you rest or take medicine. Get help right away if:  Your  cast gets damaged.  You continue to have very bad pain.  You have new pain or swelling.  Your skin or toes below the injured ankle: ? Turn blue or gray. ? Feel cold or numb. ? Lose sensitivity to touch. Summary  An ankle fracture is a break in one, two, or all three of the bones in your lower leg and lower foot.  If you have a splint, wear it as told by your health care provider. Keep it clean and dry.  If you have a cast, do not stick anything inside the cast to scratch your skin. This can cause infection.  Use ice, take medicines, raise your foot, and avoid tobacco and nicotine products. These steps will lessen pain and swelling and speed up healing. This information is not intended to replace advice given to you by your health care provider. Make sure you discuss any questions you have with your health care provider. Document Released: 02/10/2009 Document Revised: 05/20/2016 Document Reviewed: 05/20/2016 Elsevier Interactive Patient Education  [employer]2019 [employer] daily.

## 2018-09-27 NOTE — Progress Notes (Signed)
  Subjective: Patient stable.  Block still in effect on the left ankle   Objective: Vital signs in last 24 hours: Temp:  [97.3 F (36.3 C)-98.9 F (37.2 C)] 98.2 F (36.8 C) (05/31 0415) Pulse Rate:  [81-104] 88 (05/31 0415) Resp:  [11-20] 15 (05/30 2039) BP: (130-166)/(73-96) 130/79 (05/31 0415) SpO2:  [95 %-100 %] 100 % (05/31 0415) Weight:  [76.7 kg] 76.7 kg (05/30 1601)  Intake/Output from previous day: 05/30 0701 - 05/31 0700 In: 1620 [P.O.:150; I.V.:1370; IV Piggyback:100] Out: 25 [Blood:25] Intake/Output this shift: No intake/output data recorded.  Exam:  No cellulitis present Compartment soft  Labs: No results for input(s): HGB in the last 72 hours. No results for input(s): WBC, RBC, HCT, PLT in the last 72 hours. No results for input(s): NA, K, CL, CO2, BUN, CREATININE, GLUCOSE, CALCIUM in the last 72 hours. No results for input(s): LABPT, INR in the last 72 hours.  Assessment/Plan: Plan at this time is discharged today.  Prescriptions on chart.  Nonweightbearing left lower extremity.  Follow-up with me in 10 days.   Stacey Oconnor 09/27/2018, 8:49 AM

## 2018-09-27 NOTE — Progress Notes (Signed)
Orthopedic Tech Progress Note Patient Details:  Stacey Oconnor February 24, 1956 [id number]  Ortho Devices Type of Ortho Device: Crutches Ortho Device/Splint Interventions: Adjustment   Post Interventions Patient Tolerated: Well Instructions Provided: Care of device   Saul Fordyce 09/27/2018, 11:50 AM

## 2018-09-29 ENCOUNTER — Encounter (HOSPITAL_COMMUNITY): Payer: Self-pay | Admitting: Orthopedic Surgery

## 2018-09-30 NOTE — Discharge Summary (Signed)
Physician Discharge Summary      Patient ID: Stacey Oconnor MRN: [id number] DOB/AGE: May 14, 1955 63 y.o.  Admit date: 09/26/2018 Discharge date: 09/27/2018  Admission Diagnoses:  Active Problems:   Ankle fracture, bimalleolar, closed   Discharge Diagnoses:  Same  Surgeries: Procedure(s): OPEN REDUCTION INTERNAL FIXATION (ORIF) ANKLE FRACTURE on 09/26/2018   Consultants:   Discharged Condition: Stable  Hospital Course: Stacey Oconnor is an 63 y.o. female who was admitted 09/26/2018 with a chief complaint of ankle pain, and found to have a diagnosis of bimalleolar ankle fracture.  They were brought to the operating room on 09/26/2018 and underwent the above named procedures.  Patient tolerated procedure well.  She was mobilized with physical therapy on postop day #1.  Discharged home in good condition nonweightbearing on the affected ankle.  Home on oxycodone and aspirin for DVT prophylaxis.  Follow-up with me 10 days to 2 weeks.  Antibiotics given:  Anti-infectives (From admission, onward)   Start     Dose/Rate Route Frequency Ordered Stop   09/27/18 0600  ceFAZolin (ANCEF) IVPB 2g/100 mL premix     2 g 200 mL/hr over 30 Minutes Intravenous On call to O.R. 09/26/18 1555 09/26/18 1844   09/27/18 0000  ceFAZolin (ANCEF) IVPB 2g/100 mL premix     2 g 200 mL/hr over 30 Minutes Intravenous Every 6 hours 09/26/18 2055 09/27/18 0715    .  Recent vital signs:  Vitals:   09/27/18 0415 09/27/18 0854  BP: 130/79 133/75  Pulse: 88 88  Resp:  16  Temp: 98.2 F (36.8 C) 98.5 F (36.9 C)  SpO2: 100% 100%    Recent laboratory studies:  Results for orders placed or performed during the hospital encounter of 09/26/18  Surgical pcr screen  Result Value Ref Range   MRSA, PCR NEGATIVE NEGATIVE   Staphylococcus aureus NEGATIVE NEGATIVE    Discharge Medications:   Allergies as of 09/27/2018   No Known Allergies     Medication List    TAKE these medications   acetaminophen 500 MG  tablet Commonly known as:  TYLENOL Take 500 mg by mouth every 6 (six) hours as needed for mild pain.   aspirin 81 MG chewable tablet Chew 1 tablet (81 mg total) by mouth 2 (two) times a day.   methocarbamol 500 MG tablet Commonly known as:  ROBAXIN Take 1 tablet (500 mg total) by mouth every 6 (six) hours as needed for muscle spasms.   multivitamin with minerals Tabs tablet Take 1 tablet by mouth daily.   oxyCODONE 5 MG immediate release tablet Commonly known as:  Oxy IR/ROXICODONE Take 1-2 tablets (5-10 mg total) by mouth every 4 (four) hours as needed for moderate pain (pain score 4-6).   tetrahydrozoline 0.05 % ophthalmic solution Place 1 drop into both eyes daily as needed (dry eyes).       Diagnostic Studies: Dg Ankle 2 Views Left  Result Date: 09/26/2018 CLINICAL DATA:  Internal fixation EXAM: DG C-ARM 61-120 MIN; LEFT ANKLE - 2 VIEW COMPARISON:  09/26/2018 FINDINGS: Multiple intraoperative spot images demonstrate screw fixation across the medial malleolar fracture and plate and screw fixation across the distal fibular fracture. No hardware bony complicating feature. IMPRESSION: Internal fixation.  No visible complicating feature. Electronically Signed   By: Charlett Nose M.D.   On: 09/26/2018 21:25   Dg Ankle Complete Left  Result Date: 09/26/2018 CLINICAL DATA:  Fall 5 days ago EXAM: LEFT ANKLE COMPLETE - 3+ VIEW COMPARISON:  None. FINDINGS: There is  a complex fracture involving the ankle. There is a fracture through the medial malleolus at the level of the tibial plafond. There is a fracture of the distal fibula extending from the tibial plafond superiorly and posteriorly. There is marked medial displacement of the distal tibia with respect to the talus. Soft tissue swelling is noted. IMPRESSION: Bimalleolar fracture with displacement of the ankle mortise as described. Electronically Signed   By: Jolaine ClickArthur  Hoss M.D.   On: 09/26/2018 12:14   Dg Ankle Left Port  Result Date:  09/26/2018 CLINICAL DATA:  ORIF left ankle EXAM: PORTABLE LEFT ANKLE - 2 VIEW COMPARISON:  09/26/2018 FINDINGS: Plate and screw fixation across the distal fibular fracture. Screws within the medial malleolus. No hardware complicating feature. Anatomic alignment. IMPRESSION: Internal fixation.  No complicating feature. Electronically Signed   By: Charlett NoseKevin  Dover M.D.   On: 09/26/2018 21:14   Dg C-arm 1-60 Min  Result Date: 09/26/2018 CLINICAL DATA:  Internal fixation EXAM: DG C-ARM 61-120 MIN; LEFT ANKLE - 2 VIEW COMPARISON:  09/26/2018 FINDINGS: Multiple intraoperative spot images demonstrate screw fixation across the medial malleolar fracture and plate and screw fixation across the distal fibular fracture. No hardware bony complicating feature. IMPRESSION: Internal fixation.  No visible complicating feature. Electronically Signed   By: Charlett NoseKevin  Dover M.D.   On: 09/26/2018 21:25    Disposition:   Discharge Instructions    Call MD / Call 911   Complete by:  As directed    If you experience chest pain or shortness of breath, CALL 911 and be transported to the hospital emergency room.  If you develope a fever above 101 F, pus (white drainage) or increased drainage or redness at the wound, or calf pain, call your surgeon's office.   Call MD / Call 911   Complete by:  As directed    If you experience chest pain or shortness of breath, CALL 911 and be transported to the hospital emergency room.  If you develope a fever above 101 F, pus (white drainage) or increased drainage or redness at the wound, or calf pain, call your surgeon's office.   Constipation Prevention   Complete by:  As directed    Drink plenty of fluids.  Prune juice may be helpful.  You may use a stool softener, such as Colace (over the counter) 100 mg twice a day.  Use MiraLax (over the counter) for constipation as needed.   Constipation Prevention   Complete by:  As directed    Drink plenty of fluids.  Prune juice may be helpful.  You may  use a stool softener, such as Colace (over the counter) 100 mg twice a day.  Use MiraLax (over the counter) for constipation as needed.   Diet - low sodium heart healthy   Complete by:  As directed    Diet - low sodium heart healthy   Complete by:  As directed    Discharge instructions   Complete by:  As directed    Nonweightbearing left ankle Elevate left leg Return to clinic in 10 days Take aspirin for blood clot prevention as prescribed   Discharge instructions   Complete by:  As directed    Keep short leg splint and dressing dry. May use water impervious bag or cast bag and tub chair to shower Tape the top of bag to skin to avoid moisture soaking the dressing on the leg. Call if there is odor or saturation of dressing or worsening pain not controlled with medications.  Call if fever greater than 101.5. Use crutches or walker no weight bearing on the ankle fracture leg. Please follow up with an appointment with Dr. August Saucer in 10 days (10/07/2018) from the time of surgery. Elevate as often as possible during the first week after surgery gradually increasing the time the leg is dependent or down there after. If swelling recurrs then elevate again. Wheel chair for longer distances. Take baby aspirin 81 mg for anticoagulant twice   Cast or Splint Care, Adult Casts and splints are supports that are worn to protect broken bones and other injuries. A cast or splint may hold a bone still and in the correct position while it heals. Casts and splints may also help to ease pain, swelling, and muscle spasms. How to care for your cast  Do not stick anything inside the cast to scratch your skin. Check the skin around the cast every day. Tell your doctor about any concerns. You may put lotion on dry skin around the edges of the cast. Do not put lotion on the skin under the cast. Keep the cast clean. If the cast is not waterproof: Do not let it get wet. Cover it with a watertight covering when you  take a bath or a shower. How to care for your splint  Wear it as told by your doctor. Take it off only as told by your doctor. Loosen the splint if your fingers or toes tingle, get numb, or turn cold and blue. Keep the splint clean. If the splint is not waterproof: Do not let it get wet. Cover it with a watertight covering when you take a bath or a shower. Follow these instructions at home: Bathing Do not take baths or swim until your doctor says it is okay. Ask your doctor if you can take showers. You may only be allowed to take sponge baths for bathing. If your cast or splint is not waterproof, cover it with a watertight covering when you take a bath or shower. Managing pain, stiffness, and swelling Move your fingers or toes often to avoid stiffness and to lessen swelling. Raise (elevate) the injured area above the level of your heart while sitting or lying down. Safety Do not use the injured limb to support your body weight until your doctor says that it is okay. Use crutches or other assistive devices as told by your doctor. General instructions Do not put pressure on any part of the cast or splint until it is fully hardened. This may take many hours. Return to your normal activities as told by your doctor. Ask your doctor what activities are safe for you. Keep all follow-up visits as told by your doctor. This is important. Contact a doctor if: Your cast or splint gets damaged. The skin around the cast gets red or raw. The skin under the cast is very itchy or painful. Your cast or splint feels very uncomfortable. Your cast or splint is too tight or too loose. Your cast becomes wet or it starts to have a soft spot or area. You get an object stuck under your cast. Get help right away if: Your pain gets worse. The injured area tingles, gets numb, or turns blue and cold. The part of your body above or below the cast is swollen and it turns a different color (is discolored). You  cannot feel or move your fingers or toes. There is fluid leaking through the cast. You have very bad pain or pressure under the cast. You  have trouble breathing. You have shortness of breath. You have chest pain. This information is not intended to replace advice given to you by your health care provider. Make sure you discuss any questions you have with your health care provider. Document Released: 08/15/2010 Document Revised: 04/05/2016 Document Reviewed: 04/05/2016 Elsevier Interactive Patient Education  2019 [employer]   Ankle Fracture  The ankle joint is made up of the lower (distal) sections of your lower leg bones(tibia and fibula) along with a bone in your foot (talus). An ankle fracture is a break in one, two, or all three of these sections of bone. Follow these instructions at home: If you have a splint: Wear the splint as told by your doctor. Take it off only as told by your doctor. Loosen the splint if your toes tingle, become numb, or turn cold and blue. Keep the splint clean. If the splint is not waterproof: Do not let it get wet. Cover it with a watertight covering when you take a bath or a shower. If you have a cast: Do not stick anything inside the cast to scratch your skin. Doing that increases your risk of infection. Check the skin around the cast every day. Tell your doctor about any concerns. You may put lotion on dry skin around the edges of the cast. Do not put lotion on the skin underneath the cast. Keep the cast clean. If the cast is not waterproof: Do not let it get wet. Cover it with a watertight covering when you take a bath or a shower. Managing pain, stiffness, and swelling If directed, put ice on the injured area: If you have a removable splint, remove it as told by your doctor. Put ice in a plastic bag. Place a towel between your skin and the bag. Leave the ice on for 20 minutes, 2-3 times a day. Move your toes often. This prevents stiffness and  lessens swelling. Raise (elevate) the injured area above the level of your heart while you are sitting or lying down. General instructions Do not use the injured limb to support your body weight until your doctor says that you can. Use crutches as told by your doctor. Take over-the-counter and prescription medicines only as told by your doctor. Ask your doctor when it is safe to drive if you have a cast or splint. Do exercises as told by your doctor. Do not use any products that contain nicotine or tobacco, such as cigarettes and e-cigarettes. These can delay bone healing. If you need help quitting, ask your doctor. Keep all follow-up visits as told by your doctor. This is important. Contact a doctor if: Your pain or swelling gets worse. Your pain or swelling does not get better when you rest or take medicine. Get help right away if: Your cast gets damaged. You continue to have very bad pain. You have new pain or swelling. Your skin or toes below the injured ankle: Turn blue or gray. Feel cold or numb. Lose sensitivity to touch. Summary An ankle fracture is a break in one, two, or all three of the bones in your lower leg and lower foot. If you have a splint, wear it as told by your health care provider. Keep it clean and dry. If you have a cast, do not stick anything inside the cast to scratch your skin. This can cause infection. Use ice, take medicines, raise your foot, and avoid tobacco and nicotine products. These steps will lessen pain and swelling and speed up  healing. This information is not intended to replace advice given to you by your health care provider. Make sure you discuss any questions you have with your health care provider. Document Released: 02/10/2009 Document Revised: 05/20/2016 Document Reviewed: 05/20/2016 Elsevier Interactive Patient Education  [employer]. daily.   Driving restrictions   Complete by:  As directed    No driving for 10 weeks   Increase  activity slowly as tolerated   Complete by:  As directed    Increase activity slowly as tolerated   Complete by:  As directed    Lifting restrictions   Complete by:  As directed    No lifting for 12 weeks      Follow-up Information    August Saucer Corrie Mckusick, MD Follow up on 10/07/2018.   Specialty:  Orthopedic Surgery Contact information: [address] [place] [address] [phone]            Signed: Burnard Bunting 09/30/2018, 12:50 PM

## 2018-10-07 ENCOUNTER — Ambulatory Visit (INDEPENDENT_AMBULATORY_CARE_PROVIDER_SITE_OTHER): Payer: BLUE CROSS/BLUE SHIELD | Admitting: Orthopedic Surgery

## 2018-10-07 ENCOUNTER — Encounter: Payer: Self-pay | Admitting: Orthopedic Surgery

## 2018-10-07 ENCOUNTER — Other Ambulatory Visit: Payer: Self-pay

## 2018-10-07 ENCOUNTER — Ambulatory Visit (INDEPENDENT_AMBULATORY_CARE_PROVIDER_SITE_OTHER): Payer: BLUE CROSS/BLUE SHIELD

## 2018-10-07 DIAGNOSIS — S82842A Displaced bimalleolar fracture of left lower leg, initial encounter for closed fracture: Secondary | ICD-10-CM

## 2018-10-07 MED ORDER — OXYCODONE HCL 5 MG PO TABS: 5.0000 mg | ORAL_TABLET | Freq: Four times a day (QID) | ORAL | 0 refills | Status: DC | PRN

## 2018-10-07 NOTE — Progress Notes (Signed)
   Post-Op Visit Note   Patient: Stacey Oconnor           Date of Birth: 1956/01/04           MRN: [id number] Visit Date: 10/07/2018 PCP: Patient, No Pcp Per   Assessment & Plan:  Chief Complaint:  Chief Complaint  Patient presents with  . Left Ankle - Routine Post Op   Visit Diagnoses:  1. Closed bimalleolar fracture of left ankle, initial encounter     Plan: Stacey Oconnor is now about 10 days out left ankle open reduction internal fixation.  She is taking aspirin for DVT prophylaxis.  No calf tenderness on exam today.  Incisions are intact.  Stacey Oconnor refill her oxycodone and have her start doing ankle range of motion exercises.  Continue nonweightbearing.  Radiographs look good today.  Come back Monday for suture removal.  I might possibly let her start doing little bit of weightbearing in the fracture boot at that time.  Follow-Up Instructions: No follow-ups on file.   Orders:  Orders Placed This Encounter  Procedures  . XR Ankle Complete Left   Meds ordered this encounter  Medications  . oxyCODONE (ROXICODONE) 5 MG immediate release tablet    Sig: Take 1 tablet (5 mg total) by mouth every 6 (six) hours as needed for severe pain.    Dispense:  32 tablet    Refill:  0    Imaging: Xr Ankle Complete Left  Result Date: 10/07/2018 AP mortise lateral left ankle reviewed.  Bimalleolar ankle fracture in good position alignment with no complicating features.  Mortise is symmetric.   PMFS History: Patient Active Problem List   Diagnosis Date Noted  . Ankle fracture, bimalleolar, closed 09/26/2018   Past Medical History:  Diagnosis Date  . Ankle fracture    Left    History reviewed. No pertinent family history.  Past Surgical History:  Procedure Laterality Date  . ABSCESS DRAINAGE     Left Buttock  . ORIF ANKLE FRACTURE Left 09/26/2018   Procedure: OPEN REDUCTION INTERNAL FIXATION (ORIF) ANKLE FRACTURE;  Surgeon: Meredith Pel, MD;  Location: [facility];  Service:  Orthopedics;  Laterality: Left;   Social History   Occupational History  . Not on file  Tobacco Use  . Smoking status: Never Smoker  . Smokeless tobacco: Never Used  Substance and Sexual Activity  . Alcohol use: Yes    Comment: occasional  . Drug use: Never  . Sexual activity: Not on file

## 2018-10-12 ENCOUNTER — Ambulatory Visit (INDEPENDENT_AMBULATORY_CARE_PROVIDER_SITE_OTHER): Payer: BLUE CROSS/BLUE SHIELD | Admitting: Orthopedic Surgery

## 2018-10-12 ENCOUNTER — Other Ambulatory Visit: Payer: Self-pay

## 2018-10-12 ENCOUNTER — Encounter: Payer: Self-pay | Admitting: Orthopedic Surgery

## 2018-10-12 DIAGNOSIS — S82842A Displaced bimalleolar fracture of left lower leg, initial encounter for closed fracture: Secondary | ICD-10-CM

## 2018-10-12 MED ORDER — METHOCARBAMOL 500 MG PO TABS: 500.0000 mg | ORAL_TABLET | Freq: Four times a day (QID) | ORAL | 0 refills | Status: DC | PRN

## 2018-10-12 MED ORDER — OXYCODONE HCL 5 MG PO TABS: 5.0000 mg | ORAL_TABLET | ORAL | 0 refills | Status: DC | PRN

## 2018-10-12 NOTE — Progress Notes (Signed)
   Post-Op Visit Note   Patient: Stacey Oconnor           Date of Birth: [birth year]           MRN: [id number] Visit Date: 10/12/2018 PCP: Patient, No Pcp Per   Assessment & Plan:  Chief Complaint:  Chief Complaint  Patient presents with  . Left Ankle - Follow-up   Visit Diagnoses:  1. Closed bimalleolar fracture of left ankle, initial encounter     Plan: Stacey Oconnor is a patient who is now about 2 weeks out ankle fracture open reduction internal fixation.  Incision is intact.  Sutures are removed.  She has a little bit of venous stasis around the anterior aspect of the medial incision but ankle range of motion is improving.  No fluctuance or induration.  Plan at this time is partial weightbearing and fracture boot.  Ankle range of motion exercises daily.  Come back in 2 weeks for repeat clinical assessment and radiographs.  No calf tenderness today.  Follow-Up Instructions: Return in about 2 weeks (around 10/26/2018).   Orders:  No orders of the defined types were placed in this encounter.  No orders of the defined types were placed in this encounter.   Imaging: No results found.  PMFS History: Patient Active Problem List   Diagnosis Date Noted  . Ankle fracture, bimalleolar, closed 09/26/2018   Past Medical History:  Diagnosis Date  . Ankle fracture    Left    History reviewed. No pertinent family history.  Past Surgical History:  Procedure Laterality Date  . ABSCESS DRAINAGE     Left Buttock  . ORIF ANKLE FRACTURE Left 09/26/2018   Procedure: OPEN REDUCTION INTERNAL FIXATION (ORIF) ANKLE FRACTURE;  Surgeon: Meredith Pel, MD;  Location: [facility];  Service: Orthopedics;  Laterality: Left;   Social History   Occupational History  . Not on file  Tobacco Use  . Smoking status: Never Smoker  . Smokeless tobacco: Never Used  Substance and Sexual Activity  . Alcohol use: Yes    Comment: occasional  . Drug use: Never  . Sexual activity: Not on file

## 2018-10-12 NOTE — Addendum Note (Signed)
Addended by: Marcene Duos on: 10/12/2018 09:22 AM   Modules accepted: Orders

## 2018-10-13 ENCOUNTER — Telehealth: Payer: Self-pay | Admitting: Orthopedic Surgery

## 2018-10-13 ENCOUNTER — Ambulatory Visit: Payer: BLUE CROSS/BLUE SHIELD

## 2018-10-13 NOTE — Telephone Encounter (Signed)
Patient called asked if she can get another boot for her left foot. Patient said the boot is to big. The number to contact patient is [phone]

## 2018-10-13 NOTE — Telephone Encounter (Signed)
Sending to you as heads up.  Patient will ask for me or you. She will be here sometime after 1230 this afternoon to be fitted for a smaller fracture boot. The one that she was given at the urgent care is too large and she is post op.

## 2018-10-26 ENCOUNTER — Encounter: Payer: Self-pay | Admitting: Orthopedic Surgery

## 2018-10-26 ENCOUNTER — Ambulatory Visit (INDEPENDENT_AMBULATORY_CARE_PROVIDER_SITE_OTHER): Payer: BLUE CROSS/BLUE SHIELD | Admitting: Orthopedic Surgery

## 2018-10-26 ENCOUNTER — Ambulatory Visit (INDEPENDENT_AMBULATORY_CARE_PROVIDER_SITE_OTHER): Payer: BLUE CROSS/BLUE SHIELD

## 2018-10-26 ENCOUNTER — Other Ambulatory Visit: Payer: Self-pay

## 2018-10-26 VITALS — Ht 66.0 in | Wt 169.0 lb

## 2018-10-26 DIAGNOSIS — S82842A Displaced bimalleolar fracture of left lower leg, initial encounter for closed fracture: Secondary | ICD-10-CM

## 2018-10-28 ENCOUNTER — Encounter: Payer: Self-pay | Admitting: Orthopedic Surgery

## 2018-10-28 NOTE — Progress Notes (Signed)
   Post-Op Visit Note   Patient: Stacey Oconnor           Date of Birth: 09/04/1955           MRN: [id number] Visit Date: 10/26/2018 PCP: Patient, No Pcp Per   Assessment & Plan:  Chief Complaint:  Chief Complaint  Patient presents with  . Left Ankle - Follow-up    09/26/2018 ORIF Left Ankle   Visit Diagnoses:  1. Closed bimalleolar fracture of left ankle, initial encounter     Plan: Stacey Oconnor is a patient is now about a month out left ankle bimalleolar ankle fracture fixation.  She works at the [employer].  She does standing and sitting.  She like to return to work.  On examination she has actually about 10 degrees of ankle dorsiflexion past neutral.  No calf tenderness to palpation.  I will let her go into regular shoes in 2 weeks.  Okay to go back to work.  4-week return and start physical therapy just to get that last little bit of ankle range of motion back.  I will see her back in 4 weeks for final check.  Do not anticipate needing x-rays at that time unless she is symptomatically worse.  Follow-Up Instructions: Return in about 4 weeks (around 11/23/2018).   Orders:  Orders Placed This Encounter  Procedures  . XR Ankle Complete Left  . Ambulatory referral to Physical Therapy   No orders of the defined types were placed in this encounter.   Imaging: No results found.  PMFS History: Patient Active Problem List   Diagnosis Date Noted  . Ankle fracture, bimalleolar, closed 09/26/2018   Past Medical History:  Diagnosis Date  . Ankle fracture    Left    History reviewed. No pertinent family history.  Past Surgical History:  Procedure Laterality Date  . ABSCESS DRAINAGE     Left Buttock  . ORIF ANKLE FRACTURE Left 09/26/2018   Procedure: OPEN REDUCTION INTERNAL FIXATION (ORIF) ANKLE FRACTURE;  Surgeon: Meredith Pel, MD;  Location: [facility];  Service: Orthopedics;  Laterality: Left;   Social History   Occupational History  . Not on file  Tobacco Use   . Smoking status: Never Smoker  . Smokeless tobacco: Never Used  Substance and Sexual Activity  . Alcohol use: Yes    Comment: occasional  . Drug use: Never  . Sexual activity: Not on file

## 2018-11-21 ENCOUNTER — Encounter (HOSPITAL_COMMUNITY): Payer: Self-pay | Admitting: Orthopedic Surgery

## 2018-11-21 NOTE — OR Nursing (Signed)
LATE ENTRY: Missing case start time entered.  Verified with surgical time out.

## 2018-11-23 ENCOUNTER — Ambulatory Visit (INDEPENDENT_AMBULATORY_CARE_PROVIDER_SITE_OTHER): Payer: BLUE CROSS/BLUE SHIELD | Admitting: Orthopedic Surgery

## 2018-11-23 ENCOUNTER — Other Ambulatory Visit: Payer: Self-pay

## 2018-11-23 ENCOUNTER — Encounter: Payer: Self-pay | Admitting: Orthopedic Surgery

## 2018-11-23 ENCOUNTER — Ambulatory Visit (INDEPENDENT_AMBULATORY_CARE_PROVIDER_SITE_OTHER): Payer: BLUE CROSS/BLUE SHIELD

## 2018-11-23 DIAGNOSIS — S82842A Displaced bimalleolar fracture of left lower leg, initial encounter for closed fracture: Secondary | ICD-10-CM

## 2018-11-23 NOTE — Progress Notes (Signed)
   Post-Op Visit Note   Patient: Stacey Oconnor           Date of Birth: 26-Dec-1955           MRN: [id number] Visit Date: 11/23/2018 PCP: Patient, No Pcp Per   Assessment & Plan:  Chief Complaint:  Chief Complaint  Patient presents with  . Left Ankle - Routine Post Op   Visit Diagnoses:  1. Closed bimalleolar fracture of left ankle, initial encounter     Plan: Stacey Oconnor is now 2 months out left ankle bimalleolar ankle fracture fixation.  She has been doing well.  She has been in physical therapy working on range of motion and strengthening and balance exercises.  On exam she has pretty normal gait.  Appropriate amount of swelling with no erythema or warmth.  Plan at this time is for continued therapy to finish out her course over the next 2 to 3 weeks.  Follow-up with me as needed.  I think overall she has really good motion and very normal gait.  Follow-Up Instructions: No follow-ups on file.   Orders:  Orders Placed This Encounter  Procedures  . XR Ankle Complete Left   No orders of the defined types were placed in this encounter.   Imaging: Xr Ankle Complete Left  Result Date: 11/23/2018 AP lateral mortise left ankle reviewed.  Ankle fracture fixation of medial lateral malleoli in good position alignment with no complicating features.  No evidence of hardware loosening.  Mortise is symmetric.  Fracture appears healed.   PMFS History: Patient Active Problem List   Diagnosis Date Noted  . Ankle fracture, bimalleolar, closed 09/26/2018   Past Medical History:  Diagnosis Date  . Ankle fracture    Left    History reviewed. No pertinent family history.  Past Surgical History:  Procedure Laterality Date  . ABSCESS DRAINAGE     Left Buttock  . ORIF ANKLE FRACTURE Left 09/26/2018   Procedure: OPEN REDUCTION INTERNAL FIXATION (ORIF) ANKLE FRACTURE;  Surgeon: Meredith Pel, MD;  Location: [facility];  Service: Orthopedics;  Laterality: Left;   Social History    Occupational History  . Not on file  Tobacco Use  . Smoking status: Never Smoker  . Smokeless tobacco: Never Used  Substance and Sexual Activity  . Alcohol use: Yes    Comment: occasional  . Drug use: Never  . Sexual activity: Not on file

## 2019-07-19 ENCOUNTER — Ambulatory Visit: Payer: Self-pay | Attending: Internal Medicine

## 2019-07-19 DIAGNOSIS — Z23 Encounter for immunization: Secondary | ICD-10-CM

## 2019-07-19 NOTE — Progress Notes (Signed)
   Covid-19 Vaccination Clinic  Name:  Stacey Oconnor    MRN: [id number] DOB: January 20, 1956  07/19/2019  [name] was observed post Covid-19 immunization for 15 minutes without incident. She was provided with Vaccine Information Sheet and instruction to access the V-Safe system.   [name] was instructed to call 911 with any severe reactions post vaccine: Marland Kitchen Difficulty breathing  . Swelling of face and throat  . A fast heartbeat  . A bad rash all over body  . Dizziness and weakness   Immunizations Administered    Name Date Dose VIS Date Route   Pfizer COVID-19 Vaccine 07/19/2019  8:20 AM 0.3 mL 04/09/2019 Intramuscular   Manufacturer: [employer], [employer]   Lot: [id number]   NDC: [address]-0

## 2019-08-11 ENCOUNTER — Ambulatory Visit: Payer: Self-pay | Attending: Internal Medicine

## 2019-08-11 DIAGNOSIS — Z23 Encounter for immunization: Secondary | ICD-10-CM

## 2019-08-11 IMAGING — CR PORTABLE LEFT ANKLE - 2 VIEW
2 series · 2 of 2 positions shown · non-contrast
Comparison: 09/26/2018

CLINICAL DATA: ORIF left ankle

EXAM:
PORTABLE LEFT ANKLE - 2 VIEW

[AP]
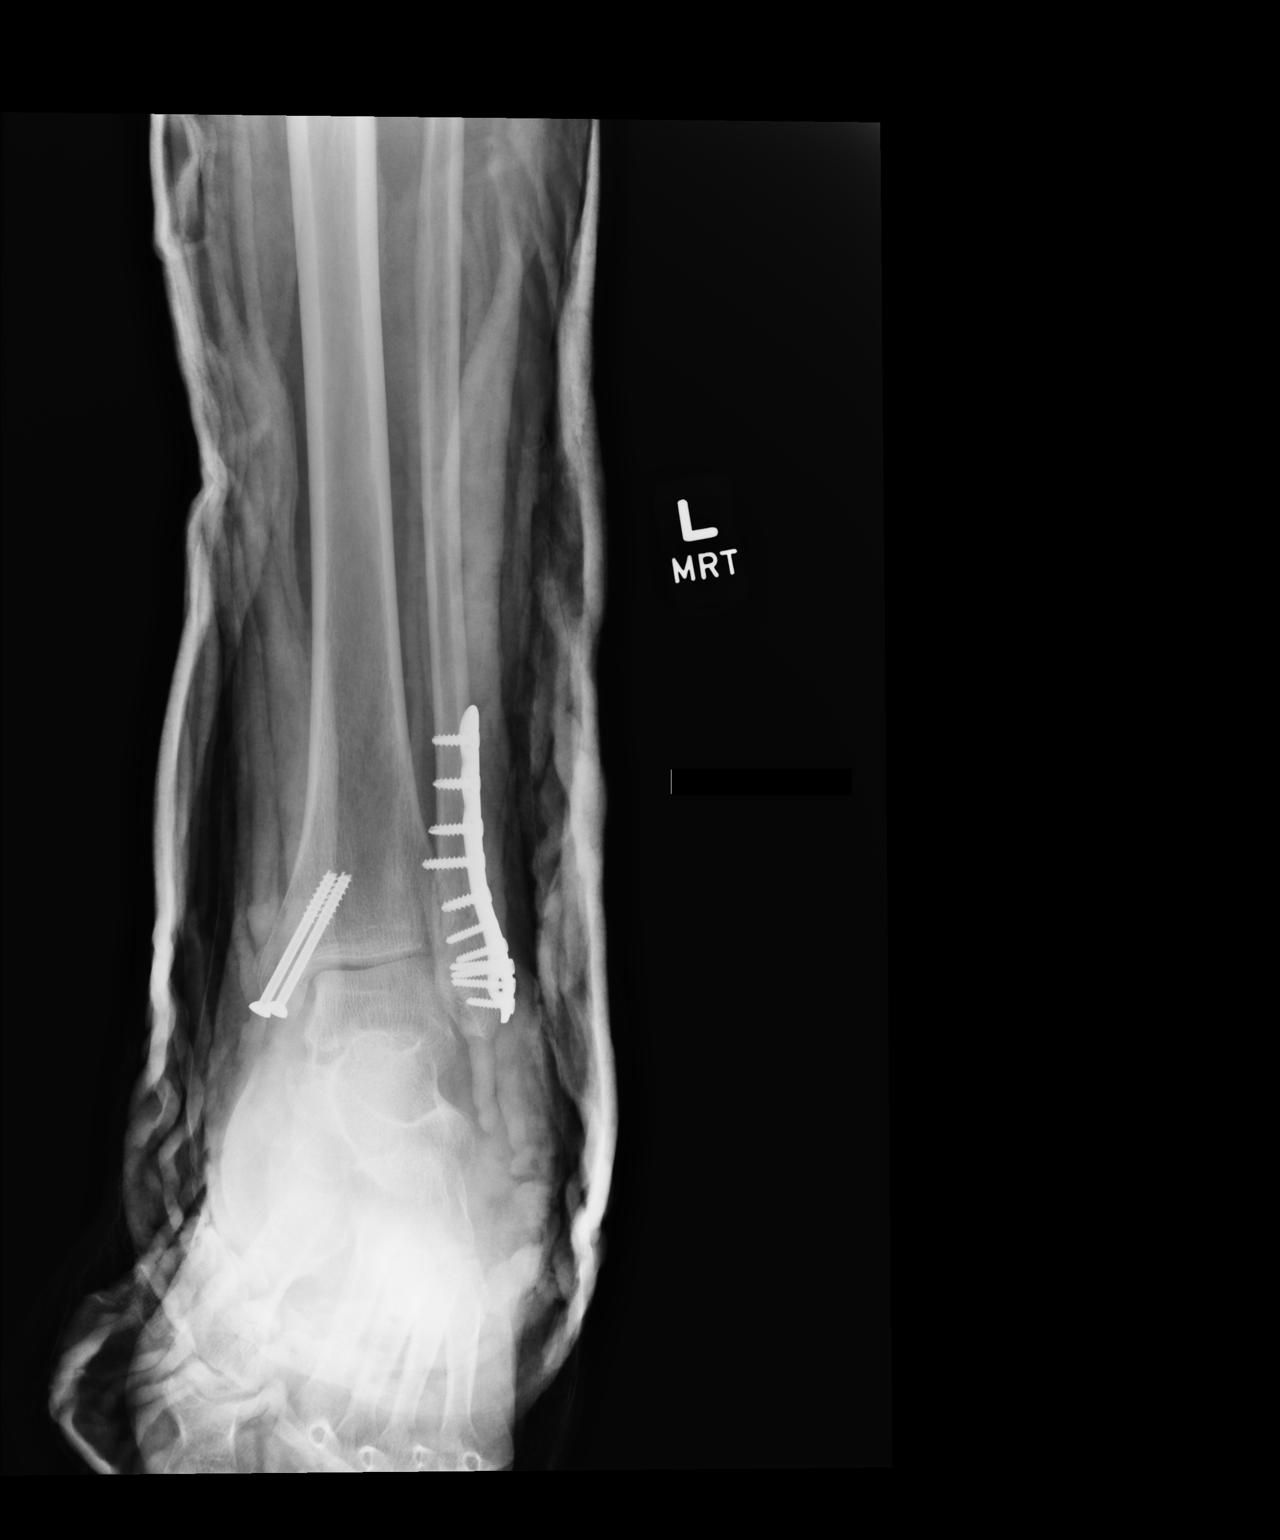

[lateral]
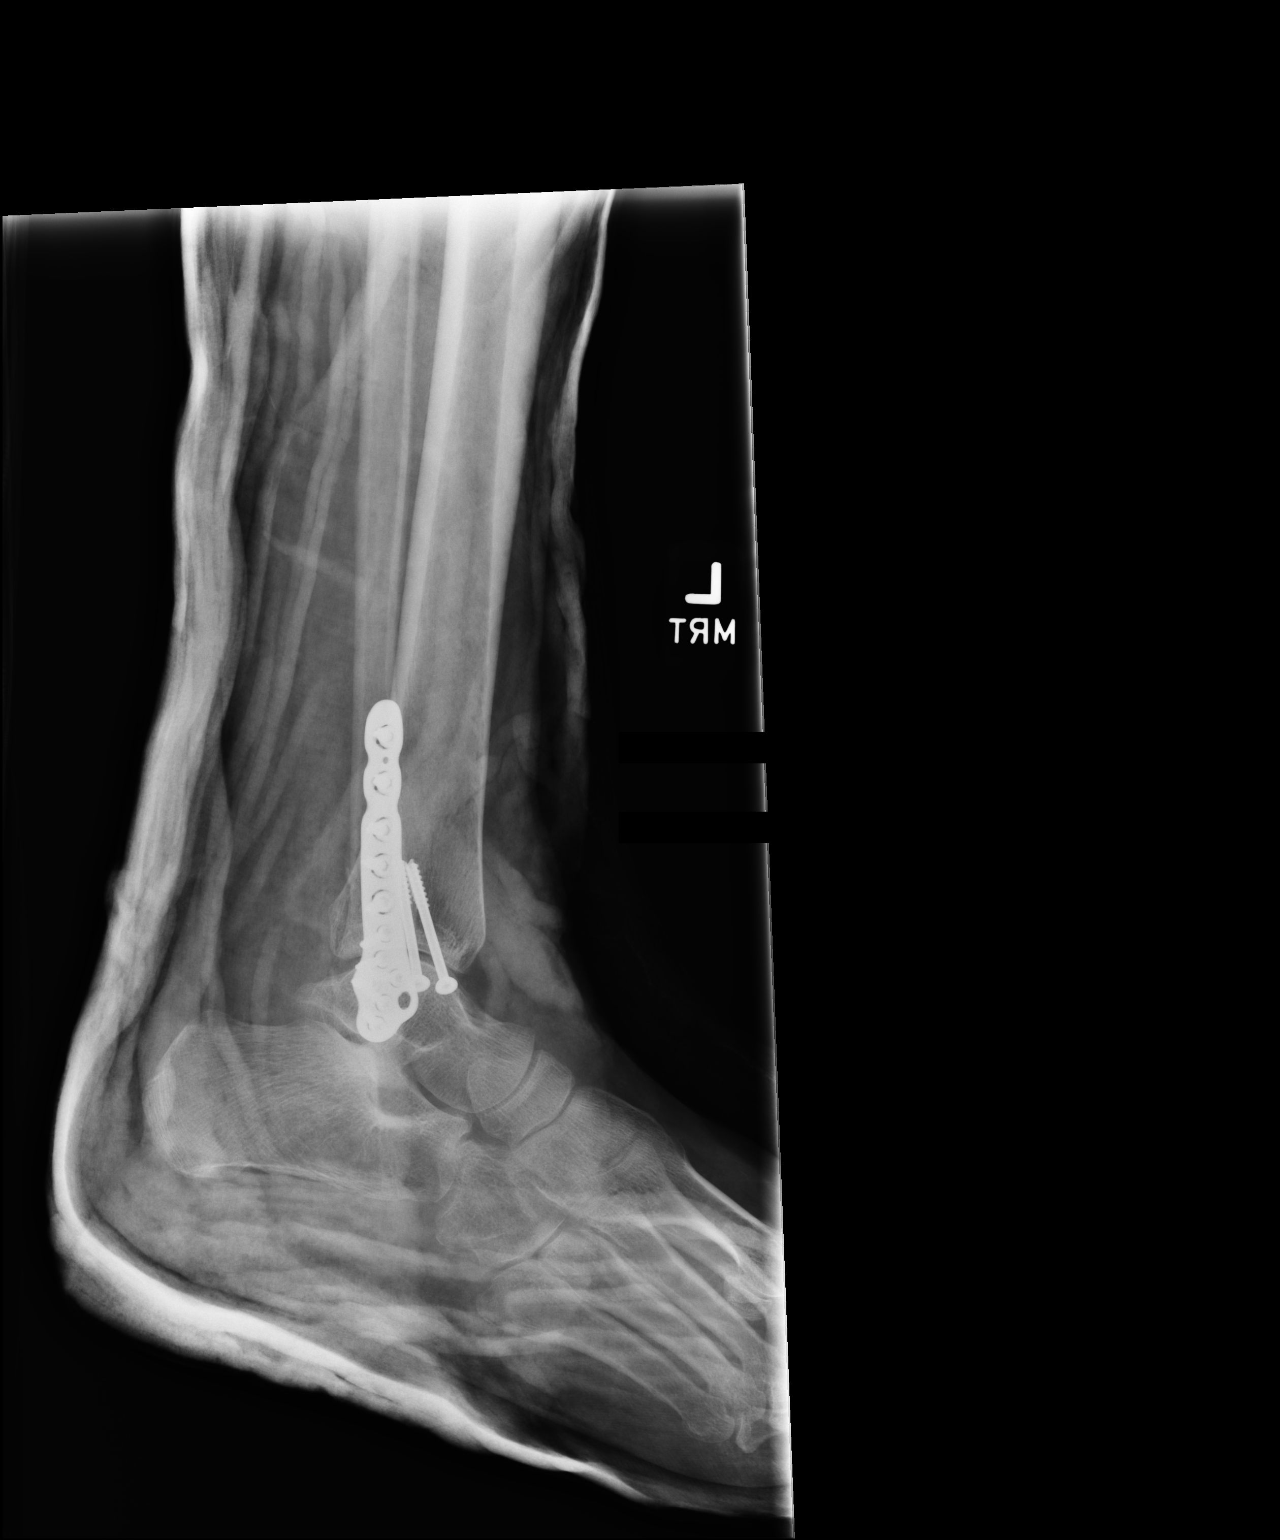

[2 of 2 positions shown; findings below may reference images not displayed]

FINDINGS: Plate and screw fixation across the distal fibular fracture. Screws
within the medial malleolus. No hardware complicating feature.
Anatomic alignment.
IMPRESSION: Internal fixation.  No complicating feature.

## 2019-08-11 NOTE — Progress Notes (Signed)
   Covid-19 Vaccination Clinic  Name:  Stacey Oconnor    MRN: [id number] DOB: 11-Oct-1955  08/11/2019  [name] was observed post Covid-19 immunization for 15 minutes without incident. She was provided with Vaccine Information Sheet and instruction to access the V-Safe system.   [name] was instructed to call 911 with any severe reactions post vaccine: Marland Kitchen Difficulty breathing  . Swelling of face and throat  . A fast heartbeat  . A bad rash all over body  . Dizziness and weakness   Immunizations Administered    Name Date Dose VIS Date Route   Pfizer COVID-19 Vaccine 08/11/2019  9:55 AM 0.3 mL 04/09/2019 Intramuscular   Manufacturer: [employer], [employer]   Lot: [id number]   NDC: [address]-9

## 2021-02-13 ENCOUNTER — Other Ambulatory Visit: Payer: Self-pay | Admitting: Internal Medicine

## 2021-02-14 LAB — CBC
HCT: 36.1 % (ref 35.0–45.0)
Hemoglobin: 13 g/dL (ref 11.7–15.5)
MCH: 37.5 pg — ABNORMAL HIGH (ref 27.0–33.0)
MCHC: 36 g/dL (ref 32.0–36.0)
MCV: 104 fL — ABNORMAL HIGH (ref 80.0–100.0)
MPV: 10.7 fL (ref 7.5–12.5)
Platelets: 207 10*3/uL (ref 140–400)
RBC: 3.47 10*6/uL — ABNORMAL LOW (ref 3.80–5.10)
RDW: 14.4 % (ref 11.0–15.0)
WBC: 2.5 10*3/uL — ABNORMAL LOW (ref 3.8–10.8)

## 2021-02-14 LAB — TSH: TSH: 1.06 mIU/L (ref 0.40–4.50)

## 2021-02-14 LAB — COMPLETE METABOLIC PANEL WITH GFR
AG Ratio: 1 (calc) (ref 1.0–2.5)
ALT: 103 U/L — ABNORMAL HIGH (ref 6–29)
AST: 211 U/L — ABNORMAL HIGH (ref 10–35)
Albumin: 3.4 g/dL — ABNORMAL LOW (ref 3.6–5.1)
Alkaline phosphatase (APISO): 234 U/L — ABNORMAL HIGH (ref 37–153)
BUN/Creatinine Ratio: 8 (calc) (ref 6–22)
BUN: 5 mg/dL — ABNORMAL LOW (ref 7–25)
CO2: 29 mmol/L (ref 20–32)
Calcium: 8.4 mg/dL — ABNORMAL LOW (ref 8.6–10.4)
Chloride: 100 mmol/L (ref 98–110)
Creat: 0.64 mg/dL (ref 0.50–1.05)
Globulin: 3.5 g/dL (calc) (ref 1.9–3.7)
Glucose, Bld: 110 mg/dL — ABNORMAL HIGH (ref 65–99)
Potassium: 3.3 mmol/L — ABNORMAL LOW (ref 3.5–5.3)
Sodium: 142 mmol/L (ref 135–146)
Total Bilirubin: 1.8 mg/dL — ABNORMAL HIGH (ref 0.2–1.2)
Total Protein: 6.9 g/dL (ref 6.1–8.1)
eGFR: 99 mL/min/{1.73_m2} (ref >=60)

## 2021-02-14 LAB — LIPID PANEL
Cholesterol: 237 mg/dL — ABNORMAL HIGH (ref <200)
HDL: 111 mg/dL (ref >=50)
LDL Cholesterol (Calc): 108 mg/dL (calc) — ABNORMAL HIGH
Non-HDL Cholesterol (Calc): 126 mg/dL (calc) (ref <130)
Total CHOL/HDL Ratio: 2.1 (calc) (ref <5.0)
Triglycerides: 87 mg/dL (ref <150)

## 2021-02-14 LAB — VITAMIN D 25 HYDROXY (VIT D DEFICIENCY, FRACTURES): Vit D, 25-Hydroxy: 14 ng/mL — ABNORMAL LOW (ref 30–100)

## 2021-02-16 ENCOUNTER — Other Ambulatory Visit: Payer: Self-pay | Admitting: Internal Medicine

## 2021-02-16 DIAGNOSIS — E2839 Other primary ovarian failure: Secondary | ICD-10-CM

## 2021-03-29 ENCOUNTER — Other Ambulatory Visit: Payer: Self-pay

## 2021-04-19 ENCOUNTER — Encounter: Payer: Self-pay | Admitting: *Deleted

## 2021-04-24 ENCOUNTER — Other Ambulatory Visit: Payer: Self-pay | Admitting: Obstetrics and Gynecology

## 2021-04-27 ENCOUNTER — Other Ambulatory Visit: Payer: Self-pay | Admitting: Obstetrics and Gynecology

## 2021-05-04 ENCOUNTER — Other Ambulatory Visit: Payer: Self-pay | Admitting: Obstetrics and Gynecology

## 2021-05-04 DIAGNOSIS — R928 Other abnormal and inconclusive findings on diagnostic imaging of breast: Secondary | ICD-10-CM

## 2021-05-09 ENCOUNTER — Other Ambulatory Visit: Payer: Self-pay | Admitting: Obstetrics and Gynecology

## 2021-05-09 DIAGNOSIS — R928 Other abnormal and inconclusive findings on diagnostic imaging of breast: Secondary | ICD-10-CM

## 2021-05-11 ENCOUNTER — Ambulatory Visit (INDEPENDENT_AMBULATORY_CARE_PROVIDER_SITE_OTHER): Payer: Self-pay

## 2021-05-11 ENCOUNTER — Ambulatory Visit (HOSPITAL_COMMUNITY)
Admission: EM | Admit: 2021-05-11 | Discharge: 2021-05-11 | Disposition: A | Discharge status: Home / Self Care | Payer: Self-pay | Attending: Student | Admitting: Student

## 2021-05-11 ENCOUNTER — Other Ambulatory Visit: Payer: Self-pay

## 2021-05-11 ENCOUNTER — Encounter (HOSPITAL_COMMUNITY): Payer: Self-pay | Admitting: Emergency Medicine

## 2021-05-11 DIAGNOSIS — W19XXXA Unspecified fall, initial encounter: Secondary | ICD-10-CM

## 2021-05-11 DIAGNOSIS — S2241XA Multiple fractures of ribs, right side, initial encounter for closed fracture: Secondary | ICD-10-CM

## 2021-05-11 NOTE — ED Provider Notes (Signed)
[facility]    CSN: [id number] Arrival date & time: 05/11/21  1145      History   Chief Complaint Chief Complaint  Patient presents with   Fall    HPI Stacey Oconnor is a 66 y.o. female presenting with right-sided rib pain following a fall that occurred 3 days ago.  Medical history noncontributory, she is not on anticoagulation.  States that she was mopping and slipped on the wet floor, falling onto the right side.  Significant pain with movement and inspiration of the right lateral ribs.  Also with some right hip pain that is tolerable.  Denies head trauma, LOC, headaches, dizziness, vision changes.  Denies abdominal pain, hematuria, change in bowel or bladder function.  Has not attempted any medications for pain.  HPI  Past Medical History:  Diagnosis Date   Ankle fracture    Left    Patient Active Problem List   Diagnosis Date Noted   Ankle fracture, bimalleolar, closed 09/26/2018    Past Surgical History:  Procedure Laterality Date   ABSCESS DRAINAGE     Left Buttock   ORIF ANKLE FRACTURE Left 09/26/2018   Procedure: OPEN REDUCTION INTERNAL FIXATION (ORIF) ANKLE FRACTURE;  Surgeon: Meredith Pel, MD;  Location: [facility];  Service: Orthopedics;  Laterality: Left;    OB History   No obstetric history on file.      Home Medications    Prior to Admission medications   Medication Sig Start Date End Date Taking? Authorizing Provider  acetaminophen (TYLENOL) 500 MG tablet Take 500 mg by mouth every 6 (six) hours as needed for mild pain.    [provider]  aspirin 81 MG chewable tablet Chew 1 tablet (81 mg total) by mouth 2 (two) times a day. 09/27/18   Meredith Pel, MD  methocarbamol (ROBAXIN) 500 MG tablet Take 1 tablet (500 mg total) by mouth every 6 (six) hours as needed for muscle spasms. 10/12/18   Meredith Pel, MD  Multiple Vitamin (MULTIVITAMIN WITH MINERALS) TABS tablet Take 1 tablet by mouth daily.    [provider]  oxyCODONE (OXY IR/ROXICODONE) 5 MG immediate release tablet Take 1-2 tablets (5-10 mg total) by mouth every 4 (four) hours as needed for moderate pain (pain score 4-6). Patient not taking: Reported on 10/26/2018 10/12/18   Meredith Pel, MD  oxyCODONE (ROXICODONE) 5 MG immediate release tablet Take 1 tablet (5 mg total) by mouth every 6 (six) hours as needed for severe pain. 10/07/18   Meredith Pel, MD  tetrahydrozoline 0.05 % ophthalmic solution Place 1 drop into both eyes daily as needed (dry eyes).    [provider]    Family History No family history on file.  Social History Social History   Tobacco Use   Smoking status: Never   Smokeless tobacco: Never  Vaping Use   Vaping Use: Never used  Substance Use Topics   Alcohol use: Yes    Comment: occasional   Drug use: Never     Allergies   Patient has no known allergies.   Review of Systems Review of Systems  Musculoskeletal:        R rib pain  All other systems reviewed and are negative.   Physical Exam Triage Vital Signs ED Triage Vitals  Enc Vitals Group     BP 05/11/21 1251 125/80     Pulse Rate 05/11/21 1251 (!) 112     Resp 05/11/21 1251 19  Temp 05/11/21 1251 98.8 F (37.1 C)     Temp Source 05/11/21 1251 Oral     SpO2 05/11/21 1251 99 %     Weight --      Height --      Head Circumference --      Peak Flow --      Pain Score 05/11/21 1250 6     Pain Loc --      Pain Edu? --      Excl. in [place]? --    No data found.  Updated Vital Signs BP 125/80 (BP Location: Right Arm)    Pulse (!) 112    Temp 98.8 F (37.1 C) (Oral)    Resp 19    SpO2 99%   Visual Acuity Right Eye Distance:   Left Eye Distance:   Bilateral Distance:    Right Eye Near:   Left Eye Near:    Bilateral Near:     Physical Exam Vitals reviewed.  Constitutional:      General: She is not in acute distress.    Appearance: Normal appearance. She is not ill-appearing or diaphoretic.  HENT:     Head:  Normocephalic and atraumatic.  Cardiovascular:     Rate and Rhythm: Normal rate and regular rhythm.     Heart sounds: Normal heart sounds.  Pulmonary:     Effort: Pulmonary effort is normal.     Breath sounds: Normal breath sounds.     Comments: Breath sounds clear throughout  Musculoskeletal:     Comments: R distal and lateral ribs TTP without skin changes, effusion, obvious bony deformity.   R hip is minimally tender to palpation, without effusion ecchymosis or skin changes. ROM flexion and extension intact and without pain. Gait intact.   No snuffbox tenderness.   No midline spinous tenderness, deformity, stepoff.   Skin:    General: Skin is warm.  Neurological:     General: No focal deficit present.     Mental Status: She is alert and oriented to person, place, and time.  Psychiatric:        Mood and Affect: Mood normal.        Behavior: Behavior normal.        Thought Content: Thought content normal.        Judgment: Judgment normal.     UC Treatments / Results  Labs (all labs ordered are listed, but only abnormal results are displayed) Labs Reviewed - No data to display  EKG   Radiology DG Ribs Unilateral W/Chest Right  Result Date: 05/11/2021 CLINICAL DATA:  Right-sided rib pain after fall EXAM: RIGHT RIBS AND CHEST - 3+ VIEW COMPARISON:  None. FINDINGS: Acute minimally displaced fractures involving the lateral aspect of the right ninth and tenth ribs. There is no evidence of pneumothorax or pleural effusion. Both lungs are clear. Heart size and mediastinal contours are within normal limits. Aortic atherosclerosis. IMPRESSION: 1. Acute minimally displaced fractures involving the lateral aspect of the right ninth and tenth ribs. 2. No pneumothorax. Electronically Signed   By: Davina Poke D.O.   On: 05/11/2021 13:11    Procedures Procedures (including critical care time)  Medications Ordered in UC Medications - No data to display  Initial Impression /  Assessment and Plan / UC Course  I have reviewed the triage vital signs and the nursing notes.  Pertinent labs & imaging results that were available during my care of the patient were reviewed by me and considered in my  medical decision making (see chart for details).     This patient is a very pleasant 66 y.o. year old female presenting with fracture 9th and 10th ribs.   Xray ribs/chest -  1. Acute minimally displaced fractures involving the lateral aspect of the right ninth and tenth ribs. 2. No pneumothorax.  Has not attempted any medications for pain, she will start with Tylenol and ibuprofen for pain relief.  Follow-up with PCP in 2 weeks for recheck, or sooner if symptoms worsen or pain is not controlled.  ED return precautions discussed. Patient verbalizes understanding and agreement.     Final Clinical Impressions(s) / UC Diagnoses   Final diagnoses:  Closed fracture of multiple ribs of right side, initial encounter     Discharge Instructions      -You have a small fracture of the 9th and 10th ribs -It is good to walk a little bit throughout the day to keep your lungs inflated, but avoid strenuous activities or exercises that cause rib pain. -You can take Tylenol up to 1000 mg 3 times daily, and ibuprofen up to 600 mg 3 times daily with food.  You can take these together, or alternate every 3-4 hours. -Ice/heat -Follow-up with primary care in 2 weeks for recheck, or sooner if symptoms worsen.  This includes shortness of breath, chest pain, dizziness, weakness.     ED Prescriptions   None    PDMP not reviewed this encounter.   Hazel Sams, PA-C 05/11/21 1333

## 2021-05-11 NOTE — Discharge Instructions (Addendum)
-  You have a small fracture of the 9th and 10th ribs -It is good to walk a little bit throughout the day to keep your lungs inflated, but avoid strenuous activities or exercises that cause rib pain. -You can take Tylenol up to 1000 mg 3 times daily, and ibuprofen up to 600 mg 3 times daily with food.  You can take these together, or alternate every 3-4 hours. -Ice/heat -Follow-up with primary care in 2 weeks for recheck, or sooner if symptoms worsen.  This includes shortness of breath, chest pain, dizziness, weakness.

## 2021-05-11 NOTE — ED Triage Notes (Signed)
Pt reports was mopping on Tuesday and didn't have enough dry floor to get around wet floor and slipped on wet floor. Pt fell on to right side. C/o right shoulder, right rib and little hip pain. Denies LOC, doesn't think hit head.

## 2021-07-13 ENCOUNTER — Encounter (HOSPITAL_COMMUNITY): Payer: Self-pay | Admitting: Emergency Medicine

## 2021-07-13 ENCOUNTER — Other Ambulatory Visit: Payer: Self-pay

## 2021-07-13 ENCOUNTER — Ambulatory Visit (HOSPITAL_COMMUNITY)
Admission: EM | Admit: 2021-07-13 | Discharge: 2021-07-13 | Disposition: A | Discharge status: Home / Self Care | Payer: 59 | Attending: Nurse Practitioner | Admitting: Nurse Practitioner

## 2021-07-13 DIAGNOSIS — S161XXA Strain of muscle, fascia and tendon at neck level, initial encounter: Secondary | ICD-10-CM

## 2021-07-13 DIAGNOSIS — S46911A Strain of unspecified muscle, fascia and tendon at shoulder and upper arm level, right arm, initial encounter: Secondary | ICD-10-CM | POA: Diagnosis not present

## 2021-07-13 MED ORDER — CYCLOBENZAPRINE HCL 5 MG PO TABS: 5.0000 mg | ORAL_TABLET | Freq: Three times a day (TID) | ORAL | 0 refills | Status: AC | PRN

## 2021-07-13 MED ORDER — METHYLPREDNISOLONE 4 MG PO TBPK: ORAL_TABLET | ORAL | 0 refills | Status: DC

## 2021-07-13 NOTE — ED Triage Notes (Signed)
Pt reports neck and right shoulder pain x 1 week. Pt states she thinks she pulled a muscle and pain increases with turning head to right side and bending down.  ?

## 2021-07-13 NOTE — Discharge Instructions (Addendum)
Take medication as prescribed. ?Recommend performing gentle range of motion exercises to help improve functionality and decreased recovery time. ?Apply heat to the affected areas for pain and stiffness, apply ice for pain or swelling.  You will need to apply for 20 minutes on, off  for 1 hour, then repeat. ?As discussed, muscle strains can take some time to heal.  If your symptoms do not improve within the next 2 to 4 weeks, you will need to follow-up with your primary care doctor for possible referral to physical therapy. ?

## 2021-07-13 NOTE — ED Provider Notes (Signed)
?[facility] ? ? ? ?CSN: [id number] ?Arrival date & time: 07/13/21  1809 ? ? ?  ? ?History   ?Chief Complaint ?Chief Complaint  ?Patient presents with  ? Neck Pain  ? Shoulder Pain  ? ? ?HPI ?Stacey Oconnor is a 66 y.o. female.  ? ?The patient is a 66 year old female who presents for right sided neck pain and right shoulder pain.  Patient states symptoms started approximately 1 week ago after she was moving which caused her to lift heavy items repeatedly.  Patient states since that time she has developed stiffness, pain, and tightness in the right side of her neck and in her shoulder.  She also has decreased range of motion in the neck.  She is able to move her shoulder but has pain with doing so.  She denies any headache, numbness, tingling, or radiation of pain into the right arm.  She has been taking Tylenol for her symptoms with some relief.  States symptoms have gotten worse over the last few days. ? ? ?Neck Pain ?Pain location:  R side ?Quality:  Aching ?Pain radiates to:  R shoulder ?Pain severity:  Moderate ?Pain is:  Worse during the night ?Progression:  Waxing and waning ?Chronicity:  New ?Context: lifting a heavy object   ?Relieved by:  Heat (rest and Tylenol) ?Associated symptoms: no headaches, no leg pain, no numbness, no photophobia, no tingling, no visual change, no weakness and no weight loss   ?Shoulder Pain ?Location:  Shoulder ?Shoulder location:  R shoulder ?Injury: no   ?Pain details:  ?  Quality:  Aching ?  Radiates to:  R upper arm ?  Progression:  Waxing and waning ?Handedness:  Right-handed ?Relieved by:  Rest and acetaminophen ?Worsened by:  Movement ?Associated symptoms: neck pain   ?Associated symptoms: no muscle weakness, no numbness, no stiffness, no swelling and no tingling   ? ?Past Medical History:  ?Diagnosis Date  ? Ankle fracture   ? Left  ? ? ?Patient Active Problem List  ? Diagnosis Date Noted  ? Ankle fracture, bimalleolar, closed 09/26/2018  ? ? ?Past Surgical  History:  ?Procedure Laterality Date  ? ABSCESS DRAINAGE    ? Left Buttock  ? ORIF ANKLE FRACTURE Left 09/26/2018  ? Procedure: OPEN REDUCTION INTERNAL FIXATION (ORIF) ANKLE FRACTURE;  Surgeon: Cammy Copa, MD;  [facility];  Service: Orthopedics;  Laterality: Left;  ? ? ?OB History   ?No obstetric history on file. ?  ? ? ? ?Home Medications   ? ?Prior to Admission medications   ?Medication Sig Start Date End Date Taking? Authorizing Provider  ?cyclobenzaprine (FLEXERIL) 5 MG tablet Take 1 tablet (5 mg total) by mouth 3 (three) times daily as needed for up to 10 days for muscle spasms. 07/13/21 07/23/21 Yes Devona Holmes-Warren, Sadie Haber, NP  ?methylPREDNISolone (MEDROL DOSEPAK) 4 MG TBPK tablet Take 6 tablets with breakfast on day 1. Take 5 tablets with breakfast on day 2. Take 4 tablets with breakfast on day 3. Take 3 tablets with breakfast on day 4. Take 2 tablets with breakfast on day 5. Take 1 tablet with breakfast on day 6. 07/13/21  Yes Tycen Dockter-Warren, Sadie Haber, NP  ?acetaminophen (TYLENOL) 500 MG tablet Take 500 mg by mouth every 6 (six) hours as needed for mild pain.    [provider]  ?aspirin 81 MG chewable tablet Chew 1 tablet (81 mg total) by mouth 2 (two) times a day. 09/27/18   Cammy Copa, MD  ?  methocarbamol (ROBAXIN) 500 MG tablet Take 1 tablet (500 mg total) by mouth every 6 (six) hours as needed for muscle spasms. 10/12/18   Cammy Copa, MD  ?Multiple Vitamin (MULTIVITAMIN WITH MINERALS) TABS tablet Take 1 tablet by mouth daily.    [provider]  ?oxyCODONE (OXY IR/ROXICODONE) 5 MG immediate release tablet Take 1-2 tablets (5-10 mg total) by mouth every 4 (four) hours as needed for moderate pain (pain score 4-6). ?Patient not taking: Reported on 10/26/2018 10/12/18   Cammy Copa, MD  ?oxyCODONE (ROXICODONE) 5 MG immediate release tablet Take 1 tablet (5 mg total) by mouth every 6 (six) hours as needed for severe pain. 10/07/18   Cammy Copa, MD   ?tetrahydrozoline 0.05 % ophthalmic solution Place 1 drop into both eyes daily as needed (dry eyes).    [provider]  ? ? ?Family History ?History reviewed. No pertinent family history. ? ?Social History ?Social History  ? ?Tobacco Use  ? Smoking status: Never  ? Smokeless tobacco: Never  ?Vaping Use  ? Vaping Use: Never used  ?Substance Use Topics  ? Alcohol use: Yes  ?  Comment: occasional  ? Drug use: Never  ? ? ? ?Allergies   ?Patient has no known allergies. ? ? ?Review of Systems ?Review of Systems  ?Constitutional: Negative.  Negative for weight loss.  ?Eyes: Negative.  Negative for photophobia.  ?Respiratory: Negative.    ?Cardiovascular: Negative.   ?Musculoskeletal:  Positive for neck pain and neck stiffness. Negative for stiffness.  ?     Right shoulder pain  ?Skin: Negative.   ?Neurological:  Negative for tingling, weakness, numbness and headaches.  ?Psychiatric/Behavioral: Negative.    ? ? ?Physical Exam ?Triage Vital Signs ?ED Triage Vitals  ?Enc Vitals Group  ?   BP 07/13/21 1832 (!) 153/97  ?   Pulse Rate 07/13/21 1832 65  ?   Resp 07/13/21 1832 16  ?   Temp 07/13/21 1832 98.6 ?F (37 ?C)  ?   Temp Source 07/13/21 1832 Oral  ?   SpO2 07/13/21 1832 97 %  ?   Weight 07/13/21 1831 169 lb 1.5 oz (76.7 kg)  ?   Height 07/13/21 1831 5\' 6"  (1.676 m)  ?   Head Circumference --   ?   Peak Flow --   ?   Pain Score 07/13/21 1830 8  ?   Pain Loc --   ?   Pain Edu? --   ?   Excl. in [place]? --   ? ?No data found. ? ?Updated Vital Signs ?BP (!) 153/97 (BP Location: Left Arm)   Pulse 65   Temp 98.6 ?F (37 ?C) (Oral)   Resp 16   Ht 5\' 6"  (1.676 m)   Wt 169 lb 1.5 oz (76.7 kg)   SpO2 97%   BMI 27.29 kg/m?  ? ?Visual Acuity ?Right Eye Distance:   ?Left Eye Distance:   ?Bilateral Distance:   ? ?Right Eye Near:   ?Left Eye Near:    ?Bilateral Near:    ? ?Physical Exam ?Vitals reviewed.  ?Constitutional:   ?   General: She is not in acute distress. ?   Appearance: Normal appearance.  ?HENT:  ?   Head:  Normocephalic and atraumatic.  ?Cardiovascular:  ?   Rate and Rhythm: Normal rate and regular rhythm.  ?   Pulses: Normal pulses.  ?   Heart sounds: Normal heart sounds.  ?Pulmonary:  ?   Effort: Pulmonary  effort is normal.  ?   Breath sounds: Normal breath sounds.  ?Abdominal:  ?   Palpations: Abdomen is soft.  ?   Tenderness: There is no abdominal tenderness.  ?Musculoskeletal:  ?   Right shoulder: Tenderness present. No swelling, deformity or crepitus. Decreased range of motion. Normal strength.  ?   Right upper arm: Normal.  ?   Cervical back: No erythema. Pain with movement and muscular tenderness present. Decreased range of motion.  ?Lymphadenopathy:  ?   Cervical: No cervical adenopathy.  ?Neurological:  ?   Mental Status: She is alert.  ? ? ? ?UC Treatments / Results  ?Labs ?(all labs ordered are listed, but only abnormal results are displayed) ?Labs Reviewed - No data to display ? ?EKG ? ? ?Radiology ?No results found. ? ?Procedures ?Procedures (including critical care time) ? ?Medications Ordered in UC ?Medications - No data to display ? ?Initial Impression / Assessment and Plan / UC Course  ?I have reviewed the triage vital signs and the nursing notes. ? ?Pertinent labs & imaging results that were available during my care of the patient were reviewed by me and considered in my medical decision making (see chart for details). ? ?The patient presents with symptoms of right neck pain and right shoulder pain after lifting heavy boxes.  Patient states symptoms worsened over the past week.  She has decreased range of motion and spasm in the right cervical spine into the trapezius and in the anterior, posterior, superior, and lateral shoulder.  She has good range of motion in the right shoulder, but has pain with such.  Her range of motion in the cervical spine is decreased.  X-rays were not performed as the patient did not suffer any trauma or mechanism that could result in fracture.  We will provide the  patient with Medrol Dosepak to help with inflammation.  We will also provide muscle relaxers for bedtime.  Patient encouraged to perform gentle range of motion exercises and trying to remain active to help improve functionality

## 2021-07-20 ENCOUNTER — Encounter (HOSPITAL_COMMUNITY): Payer: Self-pay | Admitting: Emergency Medicine

## 2021-07-20 ENCOUNTER — Other Ambulatory Visit: Payer: Self-pay

## 2021-07-20 ENCOUNTER — Ambulatory Visit (HOSPITAL_COMMUNITY)
Admission: EM | Admit: 2021-07-20 | Discharge: 2021-07-20 | Disposition: A | Discharge status: Home / Self Care | Payer: 59 | Attending: Physician Assistant | Admitting: Physician Assistant

## 2021-07-20 DIAGNOSIS — M542 Cervicalgia: Secondary | ICD-10-CM

## 2021-07-20 DIAGNOSIS — M62838 Other muscle spasm: Secondary | ICD-10-CM | POA: Diagnosis not present

## 2021-07-20 MED ORDER — MELOXICAM 7.5 MG PO TABS: 7.5000 mg | ORAL_TABLET | Freq: Every day | ORAL | 0 refills | Status: AC

## 2021-07-20 NOTE — ED Provider Notes (Signed)
?[facility] ? ? ? ?CSN: [id number] ?Arrival date & time: 07/20/21  1806 ? ? ?  ? ?History   ?Chief Complaint ?Chief Complaint  ?Patient presents with  ? Neck Pain  ? ? ?HPI ?[name] is a 66 y.o. female.  ? ?Pt seen 1 week ago for neck pain and stiffness after lifting heavy objects.  She was started on prednisone dosepak.  Reports after completing dosepak sx returned. She is experiencing persistent cervical muscle spasm and stiffness.  Reports pain is waking her up at night.  She is taking tylenol and muscle relaxer at bedtime.  She denies radiation of pain, numbness, tingling, or upper extremity weakness.  She has used ice and heat with temporary relief.  ? ? ?Past Medical History:  ?Diagnosis Date  ? Ankle fracture   ? Left  ? ? ?Patient Active Problem List  ? Diagnosis Date Noted  ? Ankle fracture, bimalleolar, closed 09/26/2018  ? ? ?Past Surgical History:  ?Procedure Laterality Date  ? ABSCESS DRAINAGE    ? Left Buttock  ? ORIF ANKLE FRACTURE Left 09/26/2018  ? Procedure: OPEN REDUCTION INTERNAL FIXATION (ORIF) ANKLE FRACTURE;  Surgeon: Cammy Copaean, Gregory Scott, MD;  [facility];  Service: Orthopedics;  Laterality: Left;  ? ? ?OB History   ?No obstetric history on file. ?  ? ? ? ?Home Medications   ? ?Prior to Admission medications   ?Medication Sig Start Date End Date Taking? Authorizing Provider  ?meloxicam (MOBIC) 7.5 MG tablet Take 1 tablet (7.5 mg total) by mouth daily. 07/20/21 08/19/21 Yes Ward, Tylene FantasiaJessica Z, PA-C  ?acetaminophen (TYLENOL) 500 MG tablet Take 500 mg by mouth every 6 (six) hours as needed for mild pain.    [provider]  ?aspirin 81 MG chewable tablet Chew 1 tablet (81 mg total) by mouth 2 (two) times a day. 09/27/18   Cammy Copaean, Gregory Scott, MD  ?cyclobenzaprine (FLEXERIL) 5 MG tablet Take 1 tablet (5 mg total) by mouth 3 (three) times daily as needed for up to 10 days for muscle spasms. 07/13/21 07/23/21  Leath-Warren, Sadie Haberhristie J, NP  ?methocarbamol (ROBAXIN) 500 MG tablet  Take 1 tablet (500 mg total) by mouth every 6 (six) hours as needed for muscle spasms. 10/12/18   Cammy Copaean, Gregory Scott, MD  ?Multiple Vitamin (MULTIVITAMIN WITH MINERALS) TABS tablet Take 1 tablet by mouth daily.    [provider]  ?oxyCODONE (OXY IR/ROXICODONE) 5 MG immediate release tablet Take 1-2 tablets (5-10 mg total) by mouth every 4 (four) hours as needed for moderate pain (pain score 4-6). ?Patient not taking: Reported on 10/26/2018 10/12/18   Cammy Copaean, Gregory Scott, MD  ?oxyCODONE (ROXICODONE) 5 MG immediate release tablet Take 1 tablet (5 mg total) by mouth every 6 (six) hours as needed for severe pain. 10/07/18   Cammy Copaean, Gregory Scott, MD  ?tetrahydrozoline 0.05 % ophthalmic solution Place 1 drop into both eyes daily as needed (dry eyes).    [provider]  ? ? ?Family History ?History reviewed. No pertinent family history. ? ?Social History ?Social History  ? ?Tobacco Use  ? Smoking status: Never  ? Smokeless tobacco: Never  ?Vaping Use  ? Vaping Use: Never used  ?Substance Use Topics  ? Alcohol use: Yes  ?  Comment: occasional  ? Drug use: Never  ? ? ? ?Allergies   ?Patient has no known allergies. ? ? ?Review of Systems ?Review of Systems  ?Constitutional:  Negative for chills and fever.  ?HENT:  Negative for  ear pain and sore throat.   ?Eyes:  Negative for pain and visual disturbance.  ?Respiratory:  Negative for cough and shortness of breath.   ?Cardiovascular:  Negative for chest pain and palpitations.  ?Gastrointestinal:  Negative for abdominal pain and vomiting.  ?Genitourinary:  Negative for dysuria and hematuria.  ?Musculoskeletal:  Positive for myalgias and neck pain. Negative for arthralgias and back pain.  ?Skin:  Negative for color change and rash.  ?Neurological:  Negative for seizures and syncope.  ?All other systems reviewed and are negative. ? ? ?Physical Exam ?Triage Vital Signs ?ED Triage Vitals  ?Enc Vitals Group  ?   BP 07/20/21 1855 (!) 168/93  ?   Pulse Rate 07/20/21 1855  69  ?   Resp 07/20/21 1855 17  ?   Temp 07/20/21 1855 98.3 ?F (36.8 ?C)  ?   Temp Source 07/20/21 1855 Oral  ?   SpO2 07/20/21 1854 98 %  ?   Weight 07/20/21 1853 169 lb 1.5 oz (76.7 kg)  ?   Height 07/20/21 1853 5\' 6"  (1.676 m)  ?   Head Circumference --   ?   Peak Flow --   ?   Pain Score 07/20/21 1853 9  ?   Pain Loc --   ?   Pain Edu? --   ?   Excl. in GC? --   ? ?No data found. ? ?Updated Vital Signs ?BP (!) 168/93 (BP Location: Right Arm)   Pulse 69   Temp 98.3 ?F (36.8 ?C) (Oral)   Resp 17   Ht 5\' 6"  (1.676 m)   Wt 169 lb 1.5 oz (76.7 kg)   SpO2 98%   BMI 27.29 kg/m?  ? ?Visual Acuity ?Right Eye Distance:   ?Left Eye Distance:   ?Bilateral Distance:   ? ?Right Eye Near:   ?Left Eye Near:    ?Bilateral Near:    ? ?Physical Exam ?Vitals and nursing note reviewed.  ?Constitutional:   ?   General: She is not in acute distress. ?   Appearance: She is well-developed.  ?HENT:  ?   Head: Normocephalic and atraumatic.  ?Eyes:  ?   Conjunctiva/sclera: Conjunctivae normal.  ?Neck:  ?   Comments: Bilateral trapezius muscle spasm with tenderness to palpation.  No midline tenderness, step offs, deformity. Decreased ROM due to muscle tightness.  ?Cardiovascular:  ?   Rate and Rhythm: Normal rate and regular rhythm.  ?   Heart sounds: No murmur heard. ?Pulmonary:  ?   Effort: Pulmonary effort is normal. No respiratory distress.  ?   Breath sounds: Normal breath sounds.  ?Abdominal:  ?   Palpations: Abdomen is soft.  ?   Tenderness: There is no abdominal tenderness.  ?Musculoskeletal:     ?   General: No swelling.  ?   Cervical back: Neck supple.  ?Skin: ?   General: Skin is warm and dry.  ?   Capillary Refill: Capillary refill takes less than 2 seconds.  ?Neurological:  ?   Mental Status: She is alert.  ?Psychiatric:     ?   Mood and Affect: Mood normal.  ? ? ? ?UC Treatments / Results  ?Labs ?(all labs ordered are listed, but only abnormal results are displayed) ?Labs Reviewed - No data to  display ? ?EKG ? ? ?Radiology ?No results found. ? ?Procedures ?Procedures (including critical care time) ? ?Medications Ordered in UC ?Medications - No data to display ? ?Initial Impression / Assessment and Plan /  UC Course  ?I have reviewed the triage vital signs and the nursing notes. ? ?Pertinent labs & imaging results that were available during my care of the patient were reviewed by me and considered in my medical decision making (see chart for details). ? ?  ? ?Will prescribe Mobic.  Advised continued use of muscle relaxer, stretching, ice/heat.  Advised follow up with ortho.  Return precautions discussed.  ?Final Clinical Impressions(s) / UC Diagnoses  ? ?Final diagnoses:  ?Neck pain  ?Muscle spasms of neck  ? ? ? ?Discharge Instructions   ? ?  ?Continue with ice or heat to affected areas as previously directed ?Take Mobic daily. ?Continue with stretching.  ?Follow up with orthopedics who may recommend physical therapy.  ? ? ?ED Prescriptions   ? ? Medication Sig Dispense Auth. Provider  ? meloxicam (MOBIC) 7.5 MG tablet Take 1 tablet (7.5 mg total) by mouth daily. 30 tablet Ward, Tylene Fantasia, PA-C  ? ?  ? ?PDMP not reviewed this encounter. ?  ?Ward, Tylene Fantasia, PA-C ?07/20/21 1930 ? ?

## 2021-07-20 NOTE — ED Triage Notes (Signed)
Pt reports neck pain. Pt was seen for same on 3/17 and was prescribed Prednisone and finished yesterday. Pt states the medication helped up until Wednesday of this week.  ?

## 2021-07-20 NOTE — Discharge Instructions (Addendum)
Continue with ice or heat to affected areas as previously directed ?Take Mobic daily. ?Continue with stretching.  ?Follow up with orthopedics who may recommend physical therapy.  ?

## 2021-12-13 ENCOUNTER — Encounter (HOSPITAL_COMMUNITY): Payer: Self-pay | Admitting: Emergency Medicine

## 2021-12-13 ENCOUNTER — Other Ambulatory Visit: Payer: Self-pay

## 2021-12-13 ENCOUNTER — Emergency Department (HOSPITAL_COMMUNITY)
Admission: EM | Admit: 2021-12-13 | Discharge: 2021-12-14 | Disposition: A | Discharge status: Home / Self Care | Payer: 59 | Attending: Emergency Medicine | Admitting: Emergency Medicine

## 2021-12-13 ENCOUNTER — Emergency Department (HOSPITAL_COMMUNITY): Payer: 59

## 2021-12-13 DIAGNOSIS — Z20822 Contact with and (suspected) exposure to covid-19: Secondary | ICD-10-CM | POA: Insufficient documentation

## 2021-12-13 DIAGNOSIS — Z7982 Long term (current) use of aspirin: Secondary | ICD-10-CM | POA: Insufficient documentation

## 2021-12-13 DIAGNOSIS — E872 Acidosis, unspecified: Secondary | ICD-10-CM | POA: Insufficient documentation

## 2021-12-13 DIAGNOSIS — R1010 Upper abdominal pain, unspecified: Secondary | ICD-10-CM | POA: Insufficient documentation

## 2021-12-13 DIAGNOSIS — R112 Nausea with vomiting, unspecified: Secondary | ICD-10-CM | POA: Insufficient documentation

## 2021-12-13 DIAGNOSIS — R0602 Shortness of breath: Secondary | ICD-10-CM | POA: Insufficient documentation

## 2021-12-13 DIAGNOSIS — R911 Solitary pulmonary nodule: Secondary | ICD-10-CM | POA: Insufficient documentation

## 2021-12-13 DIAGNOSIS — E860 Dehydration: Secondary | ICD-10-CM | POA: Insufficient documentation

## 2021-12-13 LAB — CBC
HCT: 38.7 % (ref 36.0–46.0)
Hemoglobin: 12.9 g/dL (ref 12.0–15.0)
MCH: 32.8 pg (ref 26.0–34.0)
MCHC: 33.3 g/dL (ref 30.0–36.0)
MCV: 98.5 fL (ref 80.0–100.0)
Platelets: 226 10*3/uL (ref 150–400)
RBC: 3.93 MIL/uL (ref 3.87–5.11)
RDW: 14.4 % (ref 11.5–15.5)
WBC: 10.4 10*3/uL (ref 4.0–10.5)
nRBC: 0 % (ref 0.0–0.2)

## 2021-12-13 LAB — HEPATIC FUNCTION PANEL
ALT: 24 U/L (ref 0–44)
AST: 43 U/L — ABNORMAL HIGH (ref 15–41)
Albumin: 4.7 g/dL (ref 3.5–5.0)
Alkaline Phosphatase: 101 U/L (ref 38–126)
Bilirubin, Direct: 0.1 mg/dL (ref 0.0–0.2)
Indirect Bilirubin: 1.7 mg/dL — ABNORMAL HIGH (ref 0.3–0.9)
Total Bilirubin: 1.8 mg/dL — ABNORMAL HIGH (ref 0.3–1.2)
Total Protein: 8.5 g/dL — ABNORMAL HIGH (ref 6.5–8.1)

## 2021-12-13 LAB — URINALYSIS, ROUTINE W REFLEX MICROSCOPIC
Bacteria, UA: NONE SEEN
Bilirubin Urine: NEGATIVE
Glucose, UA: NEGATIVE mg/dL
Hgb urine dipstick: NEGATIVE
Ketones, ur: 80 mg/dL — AB
Leukocytes,Ua: NEGATIVE
Nitrite: NEGATIVE
Protein, ur: 30 mg/dL — AB
Specific Gravity, Urine: 1.017 (ref 1.005–1.030)
pH: 5 (ref 5.0–8.0)

## 2021-12-13 LAB — BASIC METABOLIC PANEL
Anion gap: 22 — ABNORMAL HIGH (ref 5–15)
BUN: 18 mg/dL (ref 8–23)
CO2: 14 mmol/L — ABNORMAL LOW (ref 22–32)
Calcium: 9.5 mg/dL (ref 8.9–10.3)
Chloride: 105 mmol/L (ref 98–111)
Creatinine, Ser: 0.96 mg/dL (ref 0.44–1.00)
GFR, Estimated: >60 mL/min (ref >60)
Glucose, Bld: 102 mg/dL — ABNORMAL HIGH (ref 70–99)
Potassium: 4.7 mmol/L (ref 3.5–5.1)
Sodium: 141 mmol/L (ref 135–145)

## 2021-12-13 LAB — TROPONIN I (HIGH SENSITIVITY)
Troponin I (High Sensitivity): 8 ng/L (ref <18)
Troponin I (High Sensitivity): 14 ng/L (ref <18)

## 2021-12-13 LAB — LIPASE, BLOOD: Lipase: 23 U/L (ref 11–51)

## 2021-12-13 LAB — SARS CORONAVIRUS 2 BY RT PCR: SARS Coronavirus 2 by RT PCR: NEGATIVE

## 2021-12-13 MED ORDER — ONDANSETRON 4 MG PO TBDP
4.0000 mg | ORAL_TABLET | Freq: Once | ORAL | Status: AC
  Administered 2021-12-13: 4 mg via ORAL
  Filled 2021-12-13: qty 1

## 2021-12-13 NOTE — ED Triage Notes (Signed)
Patient from home c/o shortness of breath with exertion over the last two days. Endorses chest pain that started today in the middle of the chest, describes it as a tightness. States she's been feeling nauseated as well.

## 2021-12-13 NOTE — ED Provider Triage Note (Signed)
Emergency Medicine Provider Triage Evaluation Note  Mycah Mcdougall , a 66 y.o. female  was evaluated in triage.  Pt complains of SOB. On Tuesday, she reports that she wasn't feeling well and slept all day Wednesday. She reports she tried drinking water today and was having some emesis. She report some SOB and feels her heart racing fast when she moves. Denies any belly pain or fever. Reports mild cough/  Review of Systems  Positive:  Negative:   Physical Exam  BP (!) 143/89   Pulse (!) 109   Temp 99.2 F (37.3 C) (Oral)   Resp 18   SpO2 95%  Gen:   Awake, no distress   Resp:  Normal effort  MSK:   Moves extremities without difficulty  Other:  tachy  Medical Decision Making  Medically screening exam initiated at 6:20 PM.  Appropriate orders placed.  Veida Spira was informed that the remainder of the evaluation will be completed by another provider, this initial triage assessment does not replace that evaluation, and the importance of remaining in the ED until their evaluation is complete.  Labs ordered.. Zofran ordered.   Achille Rich, [place] 12/13/21 1823

## 2021-12-14 ENCOUNTER — Emergency Department (HOSPITAL_COMMUNITY): Payer: 59

## 2021-12-14 LAB — I-STAT VENOUS BLOOD GAS, ED
Acid-base deficit: 7 mmol/L — ABNORMAL HIGH (ref 0.0–2.0)
Bicarbonate: 17.8 mmol/L — ABNORMAL LOW (ref 20.0–28.0)
Calcium, Ion: 1.21 mmol/L (ref 1.15–1.40)
HCT: 34 % — ABNORMAL LOW (ref 36.0–46.0)
Hemoglobin: 11.6 g/dL — ABNORMAL LOW (ref 12.0–15.0)
O2 Saturation: 67 %
Potassium: 4.1 mmol/L (ref 3.5–5.1)
Sodium: 142 mmol/L (ref 135–145)
TCO2: 19 mmol/L — ABNORMAL LOW (ref 22–32)
pCO2, Ven: 31.5 mmHg — ABNORMAL LOW (ref 44–60)
pH, Ven: 7.361 (ref 7.25–7.43)
pO2, Ven: 36 mmHg (ref 32–45)

## 2021-12-14 LAB — RAPID URINE DRUG SCREEN, HOSP PERFORMED
Amphetamines: NOT DETECTED
Barbiturates: NOT DETECTED
Benzodiazepines: NOT DETECTED
Cocaine: NOT DETECTED
Opiates: NOT DETECTED
Tetrahydrocannabinol: NOT DETECTED

## 2021-12-14 LAB — BASIC METABOLIC PANEL
Anion gap: 11 (ref 5–15)
BUN: 13 mg/dL (ref 8–23)
CO2: 15 mmol/L — ABNORMAL LOW (ref 22–32)
Calcium: 8.6 mg/dL — ABNORMAL LOW (ref 8.9–10.3)
Chloride: 118 mmol/L — ABNORMAL HIGH (ref 98–111)
Creatinine, Ser: 0.77 mg/dL (ref 0.44–1.00)
GFR, Estimated: >60 mL/min (ref >60)
Glucose, Bld: 87 mg/dL (ref 70–99)
Potassium: 3.8 mmol/L (ref 3.5–5.1)
Sodium: 144 mmol/L (ref 135–145)

## 2021-12-14 LAB — LACTIC ACID, PLASMA: Lactic Acid, Venous: 1.1 mmol/L (ref 0.5–1.9)

## 2021-12-14 LAB — SALICYLATE LEVEL: Salicylate Lvl: <7 mg/dL — ABNORMAL LOW (ref 7.0–30.0)

## 2021-12-14 LAB — ACETAMINOPHEN LEVEL: Acetaminophen (Tylenol), Serum: <10 ug/mL — ABNORMAL LOW (ref 10–30)

## 2021-12-14 LAB — D-DIMER, QUANTITATIVE: D-Dimer, Quant: 2.14 ug/mL-FEU — ABNORMAL HIGH (ref 0.00–0.50)

## 2021-12-14 LAB — ETHANOL: Alcohol, Ethyl (B): <10 mg/dL (ref <10)

## 2021-12-14 MED ORDER — SODIUM CHLORIDE 0.9 % IV BOLUS
1000.0000 mL | Freq: Once | INTRAVENOUS | Status: AC
Start: 2021-12-14 — End: 2021-12-14
  Administered 2021-12-14: 1000 mL via INTRAVENOUS

## 2021-12-14 MED ORDER — SODIUM CHLORIDE 0.9 % IV BOLUS
1000.0000 mL | Freq: Once | INTRAVENOUS | Status: AC
  Administered 2021-12-14: 1000 mL via INTRAVENOUS

## 2021-12-14 MED ORDER — IOHEXOL 350 MG/ML SOLN
75.0000 mL | Freq: Once | INTRAVENOUS | Status: AC | PRN
  Administered 2021-12-14: 75 mL via INTRAVENOUS

## 2021-12-14 MED ORDER — LORAZEPAM 2 MG/ML IJ SOLN
1.0000 mg | Freq: Once | INTRAMUSCULAR | Status: AC
  Administered 2021-12-14: 1 mg via INTRAVENOUS
  Filled 2021-12-14: qty 1

## 2021-12-14 MED ORDER — SODIUM CHLORIDE 0.9 % IV BOLUS (SEPSIS)
1000.0000 mL | Freq: Once | INTRAVENOUS | Status: AC
  Administered 2021-12-14: 1000 mL via INTRAVENOUS

## 2021-12-14 NOTE — ED Notes (Signed)
Patient ambulated well to the restroom, steady gait. Patient noted to have shakiness all over body, pt reports being cold and probably nerves. Provided warm blankets.

## 2021-12-14 NOTE — ED Provider Notes (Signed)
[facility] EMERGENCY DEPARTMENT Provider Note   CSN: [id number] Arrival date & time: 12/13/21  1650     History  Chief Complaint  Patient presents with   Shortness of Breath    Stacey Oconnor is a 66 y.o. female.  The history is provided by the patient.     Patient reports approximately 3 days ago she began having episodes of nausea and vomiting.  She does report some loose stool that has improved.  The vomit and stools are nonbloody.  She reports that she is unable to take any fluids or food.  She reports mild upper abdominal discomfort. She reports over the past 24 hours she has been having increasing shortness of breath and chest tightness.  She also reports palpitations.  No syncope. No history of CAD/VTE.  She is a non-smoker.  She reports occasional alcohol use.  No recent travel or surgery.  She uses an occasional Tylenol but none recently.  Denies any aspirin or Goody powder use.  She only takes multivitamins.  Home Medications Prior to Admission medications   Medication Sig Start Date End Date Taking? Authorizing Provider  acetaminophen (TYLENOL) 500 MG tablet Take 500 mg by mouth every 6 (six) hours as needed for mild pain.    [provider]  aspirin 81 MG chewable tablet Chew 1 tablet (81 mg total) by mouth 2 (two) times a day. 09/27/18   Meredith Pel, MD  Multiple Vitamin (MULTIVITAMIN WITH MINERALS) TABS tablet Take 1 tablet by mouth daily.    [provider]  tetrahydrozoline 0.05 % ophthalmic solution Place 1 drop into both eyes daily as needed (dry eyes).    [provider]      Allergies    Patient has no known allergies.    Review of Systems   Review of Systems  Respiratory:  Positive for shortness of breath.   Cardiovascular:  Positive for palpitations.  Gastrointestinal:  Positive for vomiting.    Physical Exam Updated Vital Signs BP (!) 130/103 (BP Location: Right Arm)   Pulse (!) 116   Temp 99.7  F (37.6 C) (Oral)   Resp 20   SpO2 100%  Physical Exam CONSTITUTIONAL: Well developed/well nourished, patient smells of ketones HEAD: Normocephalic/atraumatic EYES: EOMI/PERRL, no icterus ENMT: Mucous membranes dry NECK: supple no meningeal signs SPINE/BACK:entire spine nontender CV: S1/S2 noted, tachycardiac LUNGS: Lungs are clear to auscultation bilaterally, no apparent distress ABDOMEN: soft, mild epigastric tenderness, no rebound or guarding, bowel sounds noted throughout abdomen GU:no cva tenderness NEURO: Pt is awake/alert/appropriate, moves all extremitiesx4.  No facial droop.   EXTREMITIES: pulses normal/equal, full ROM, no lower extremity edema or tenderness SKIN: warm, color normal PSYCH: no abnormalities of mood noted, alert and oriented to situation  ED Results / Procedures / Treatments   Labs (all labs ordered are listed, but only abnormal results are displayed) Labs Reviewed  BASIC METABOLIC PANEL - Abnormal; Notable for the following components:      Result Value   CO2 14 (*)    Glucose, Bld 102 (*)    Anion gap 22 (*)    All other components within normal limits  HEPATIC FUNCTION PANEL - Abnormal; Notable for the following components:   Total Protein 8.5 (*)    AST 43 (*)    Total Bilirubin 1.8 (*)    Indirect Bilirubin 1.7 (*)    All other components within normal limits  URINALYSIS, ROUTINE W REFLEX MICROSCOPIC - Abnormal; Notable for the following  components:   Ketones, ur 80 (*)    Protein, ur 30 (*)    All other components within normal limits  SALICYLATE LEVEL - Abnormal; Notable for the following components:   Salicylate Lvl <7.0 (*)    All other components within normal limits  ACETAMINOPHEN LEVEL - Abnormal; Notable for the following components:   Acetaminophen (Tylenol), Serum <10 (*)    All other components within normal limits  D-DIMER, QUANTITATIVE - Abnormal; Notable for the following components:   D-Dimer, Quant 2.14 (*)    All other  components within normal limits  I-STAT VENOUS BLOOD GAS, ED - Abnormal; Notable for the following components:   pCO2, Ven 31.5 (*)    Bicarbonate 17.8 (*)    TCO2 19 (*)    Acid-base deficit 7.0 (*)    HCT 34.0 (*)    Hemoglobin 11.6 (*)    All other components within normal limits  SARS CORONAVIRUS 2 BY RT PCR  CBC  LIPASE, BLOOD  ETHANOL  LACTIC ACID, PLASMA  RAPID URINE DRUG SCREEN, HOSP PERFORMED  BLOOD GAS, VENOUS  LACTIC ACID, PLASMA  TROPONIN I (HIGH SENSITIVITY)  TROPONIN I (HIGH SENSITIVITY)    EKG EKG Interpretation  Date/Time:  Thursday December 13 2021 17:37:56 EDT Ventricular Rate:  114 PR Interval:  174 QRS Duration: 68 QT Interval:  336 QTC Calculation: 463 R Axis:   52 Text Interpretation: Sinus tachycardia Cannot rule out Anterior infarct , age undetermined Abnormal ECG Confirmed by Zadie Rhine ([id number]) on 12/14/2021 5:42:15 AM  Radiology DG Chest 2 View  Result Date: 12/13/2021 CLINICAL DATA:  Chest pain, shortness of breath EXAM: CHEST - 2 VIEW COMPARISON:  05/11/2021 FINDINGS: Cardiac size is within normal limits. Lung fields are clear of any infiltrates or pulmonary edema. In the AP view, there is a faint 3 mm nodular density in the lateral aspect of right upper lung field overlying the anterior right third rib. There is no pleural effusion or pneumothorax. No significant interval changes are noted. IMPRESSION: There are no signs of pulmonary edema or focal pulmonary consolidation. There is faint 3 mm nodular density in right upper lung field which may suggest an artifact or pleural plaque or new parenchymal nodule in the lung fields. Follow-up chest radiographs in 3 months along with CT if warranted should be considered. Electronically Signed   By: Ernie Avena M.D.   On: 12/13/2021 18:48    Procedures .Critical Care E&M  Performed by: Zadie Rhine, MD Critical care provider statement:    Critical care time (minutes):  48   Critical care  start time:  12/14/2021 6:30 AM   Critical care end time:  12/14/2021 7:18 AM   Critical care time was exclusive of:  Separately billable procedures and treating other patients   Critical care was necessary to treat or prevent imminent or life-threatening deterioration of the following conditions:  Respiratory failure, metabolic crisis and toxidrome   Critical care was time spent personally by me on the following activities:  Ordering and review of laboratory studies, ordering and review of radiographic studies, ordering and performing treatments and interventions, development of treatment plan with patient or surrogate, evaluation of patient's response to treatment, examination of patient and re-evaluation of patient's condition   I assumed direction of critical care for this patient from another provider in my specialty: no   After initial E/M assessment, critical care services were subsequently performed that were exclusive of separately billable procedures or treatment.  Medications Ordered in ED Medications  sodium chloride 0.9 % bolus 1,000 mL (has no administration in time range)  ondansetron (ZOFRAN-ODT) disintegrating tablet 4 mg (4 mg Oral Given 12/13/21 1825)  sodium chloride 0.9 % bolus 1,000 mL (1,000 mLs Intravenous New Bag/Given 12/14/21 3329)    ED Course/ Medical Decision Making/ A&P Clinical Course as of 12/14/21 0717  Fri Dec 14, 2021  0552 CO2(!): 14 Anion gap noted [DW]  0552 Dehydration noted [DW]  [phone] Patient presents with shortness of breath and palpitations.  She reports vomiting for up to 3 days.  Patient appears dehydrated and has an anion gap.  Patient be given IV fluids, expand lab work-up and reassess [DW]  0715 Patient is improving with IV fluids.  Patient now reports she drinks alcohol frequently.  Suspicion this may represent alcoholic ketoacidosis.  She does have an elevated D-dimer with tachycardia and shortness of breath.  Will obtain CT chest. [DW]   0716 At signout to Dr. Jeraldine Loots, follow-up with CT chest and may need repeat electrolytes. [DW]  4166 Her PCP is Dr. Concepcion Elk [DW]    Clinical Course User Index [DW] Zadie Rhine, MD                           Medical Decision Making Amount and/or Complexity of Data Reviewed Labs: ordered. Decision-making details documented in ED Course. Radiology: ordered.   This patient presents to the ED for concern of vomiting, this involves an extensive number of treatment options, and is a complaint that carries with it a high risk of complications and morbidity.  The differential diagnosis includes but is not limited to gastritis, gastroenteritis, peptic ulcer disease, acute coronary syndrome  Social Determinants of Health: Patient's  occasional alcohol use   increases the complexity of managing their presentation  Additional history obtained:  Records reviewed previous admission documents  Lab Tests: I Ordered, and personally interpreted labs.  The pertinent results include: Anion gap, ketonuria, dehydration  Imaging Studies ordered: I ordered imaging studies including X-ray chest   I independently visualized and interpreted imaging which showed no acute findings I agree with the radiologist interpretation  Cardiac Monitoring: The patient was maintained on a cardiac monitor.  I personally viewed and interpreted the cardiac monitor which showed an underlying rhythm of:  sinus tachycardia  Medicines ordered and prescription drug management: I ordered medication including IV fluids for dehydration Reevaluation of the patient after these medicines showed that the patient    improved  Critical Interventions:  IVFluids    Reevaluation: After the interventions noted above, I reevaluated the patient and found that they have :improved  Complexity of problems addressed: Patient's presentation is most consistent with  acute presentation with potential threat to life or bodily  function            Final Clinical Impression(s) / ED Diagnoses Final diagnoses:  Dehydration  Acidosis  Shortness of breath  Pulmonary nodule    Rx / DC Orders ED Discharge Orders     None         Zadie Rhine, MD 12/14/21 [phone]

## 2021-12-14 NOTE — ED Notes (Signed)
Patient transported to CT 

## 2021-12-14 NOTE — ED Provider Notes (Signed)
Care the patient assumed at signout.  On my initial evaluation the patient is awake, alert, speaking clearly.  She is tachycardic, but not hypotensive.  At this point the patient has received 1+ liter fluid resuscitation.  CT chest without evidence for acute pulmonary embolism. On discussing patient's history she notes that she drink 1 to 2 glasses of wine per week, no daily alcohol intake according to her.  She does note that she has not been eating or drinking appropriately recently, due to increased stress as well as grief due to the death of her brother 1 month ago.  With persistent tachycardia she will receive additional fluids, anxiolytic, require repeat evaluation.  3:25 PM Heart rate 105, patient in no distress.  She has been monitored for hours after fluid resuscitation, with eventual substantial decrease in her heart rate.  Repeat chemistry panel also reassuring.  She and I discussed need for outpatient follow-up, home monitoring, patient discharged in stable condition.   Gerhard Munch, MD 12/14/21 1525

## 2021-12-14 NOTE — Discharge Instructions (Signed)
As discussed, your evaluation today has been largely reassuring.  But, it is important that you monitor your condition carefully, and do not hesitate to return to the ED if you develop new, or concerning changes in your condition. ? ?Otherwise, please follow-up with your physician for appropriate ongoing care. ? ?

## 2022-03-26 IMAGING — DX DG RIBS W/ CHEST 3+V*R*
3 series · 3 of 3 positions shown · non-contrast
Comparison: None.

CLINICAL DATA: Right-sided rib pain after fall

EXAM:
RIGHT RIBS AND CHEST - 3+ VIEW

[chest pa]
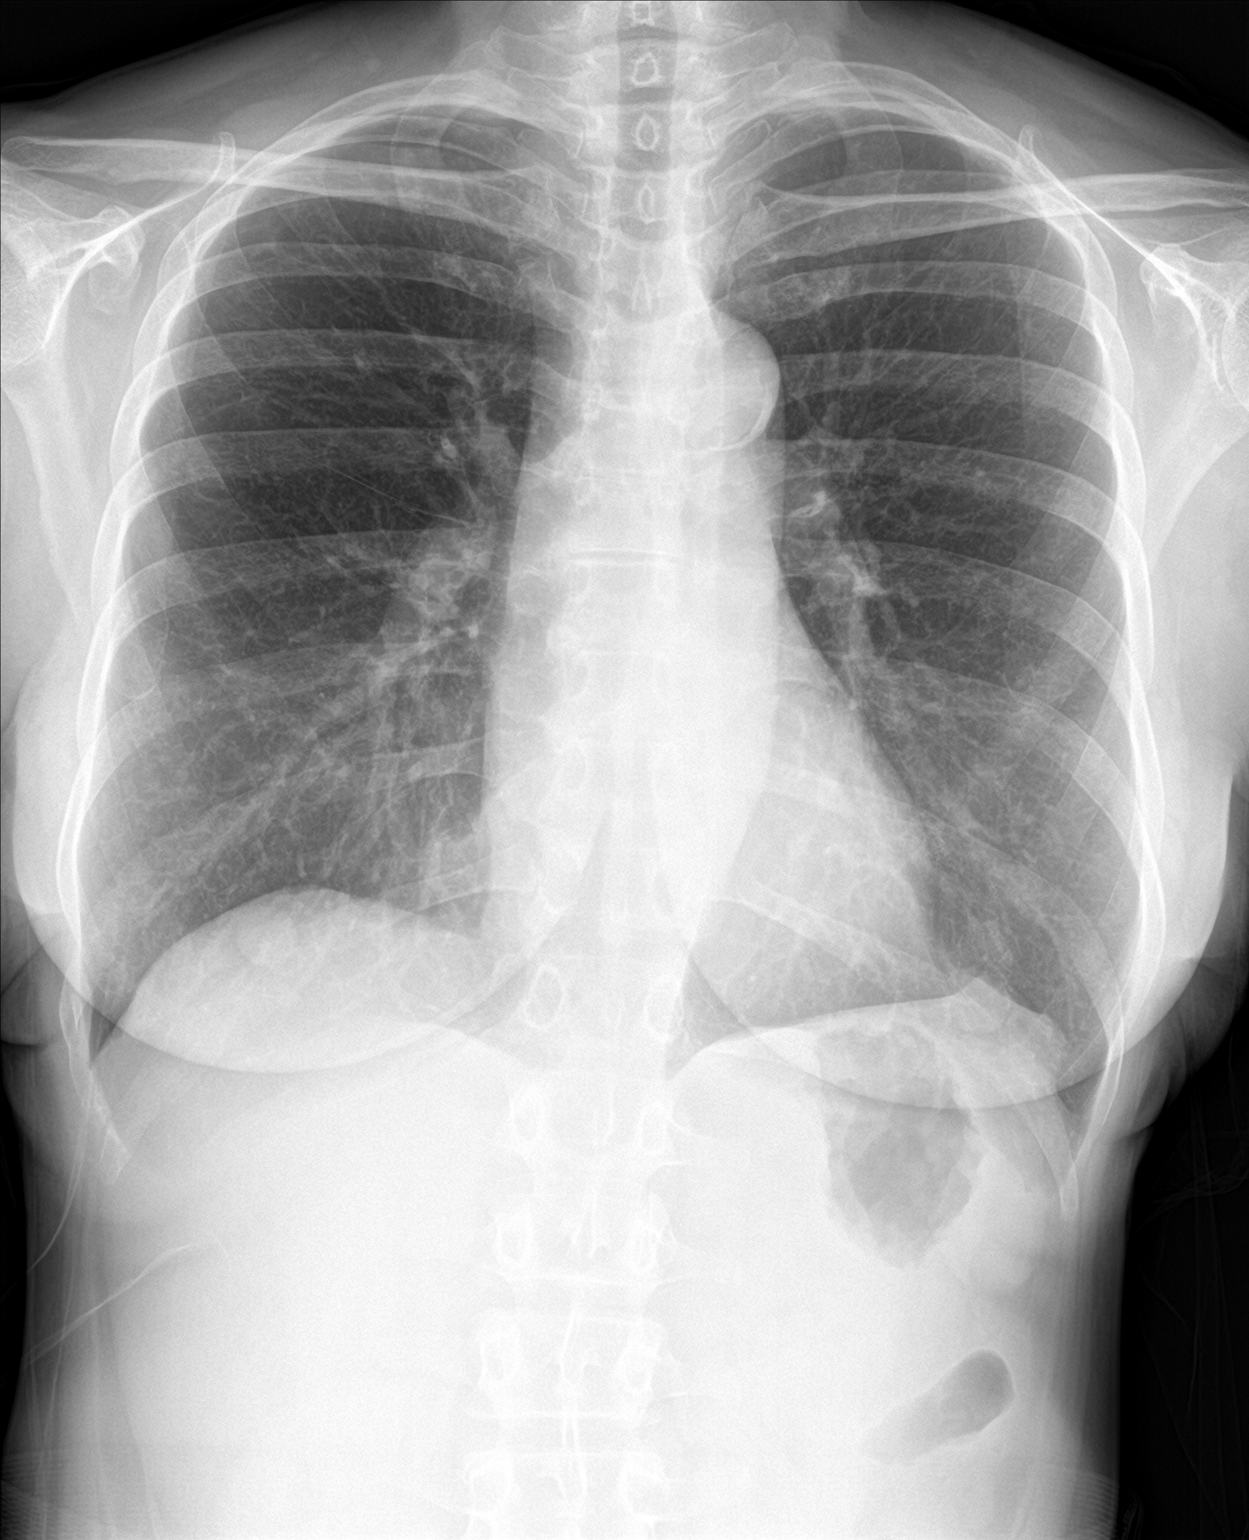

[rib pa]
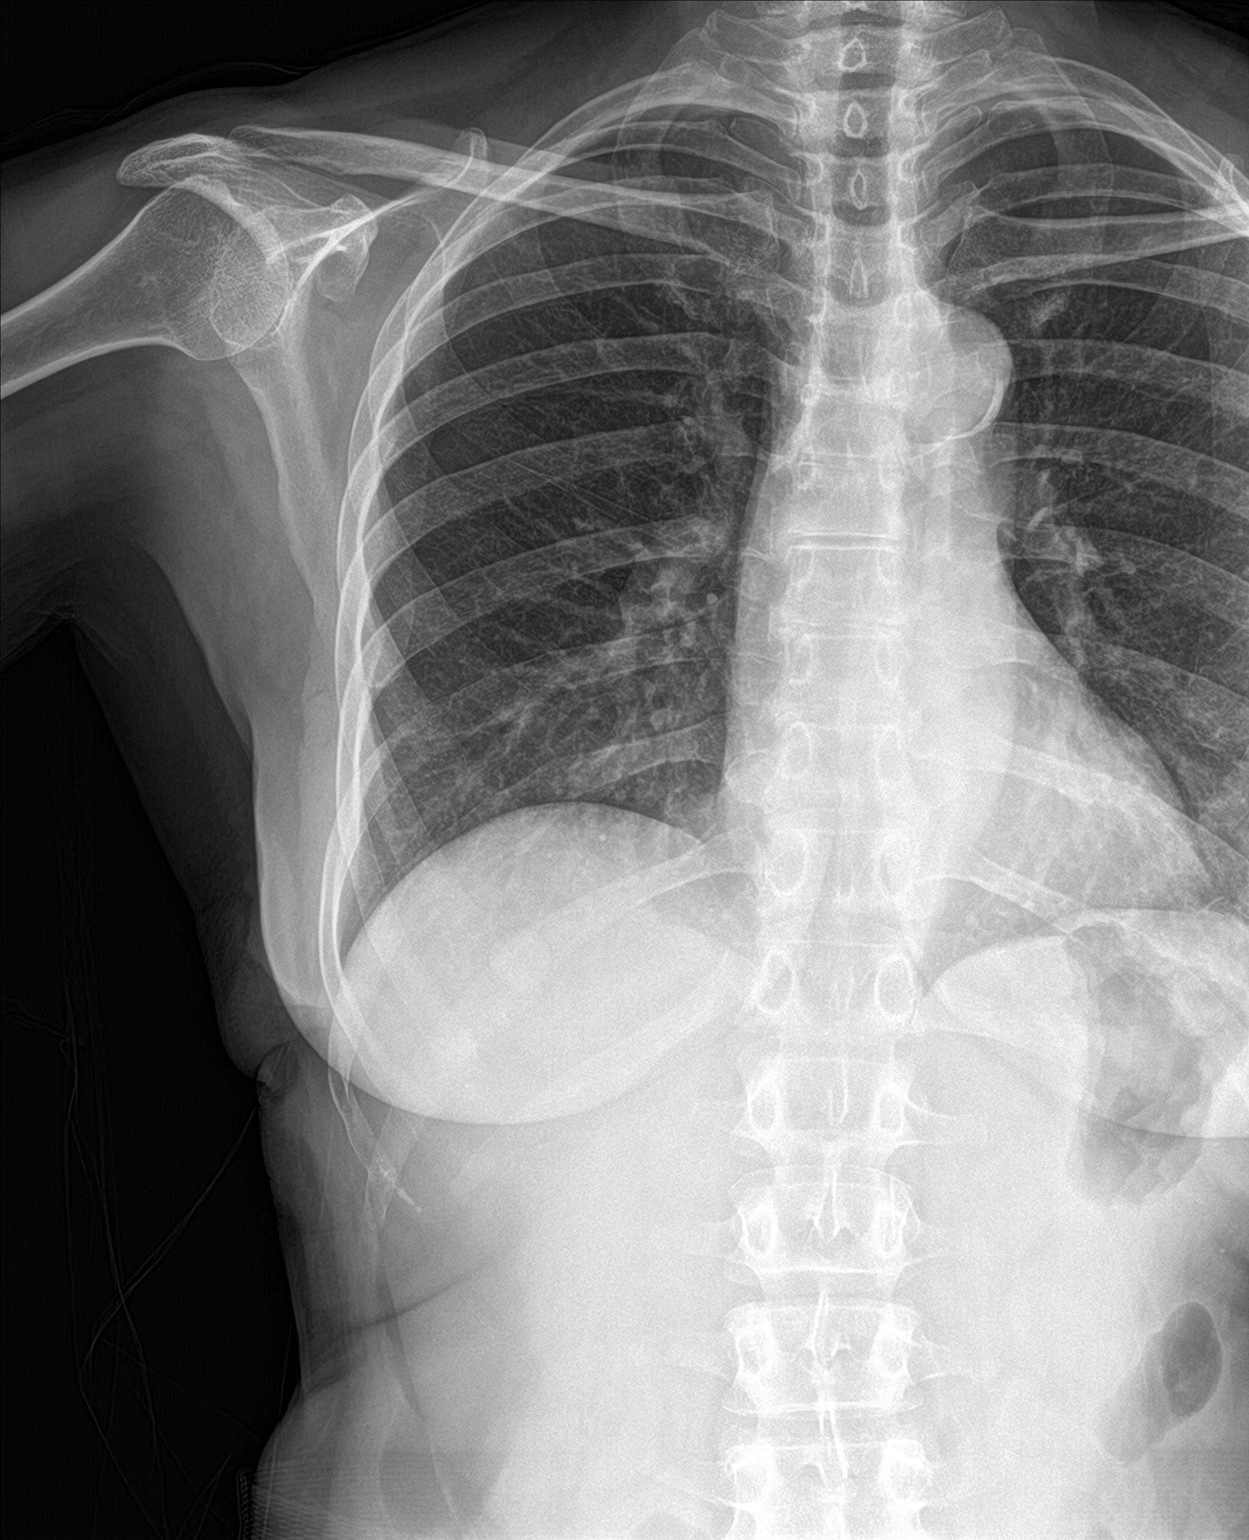

[rib obl]
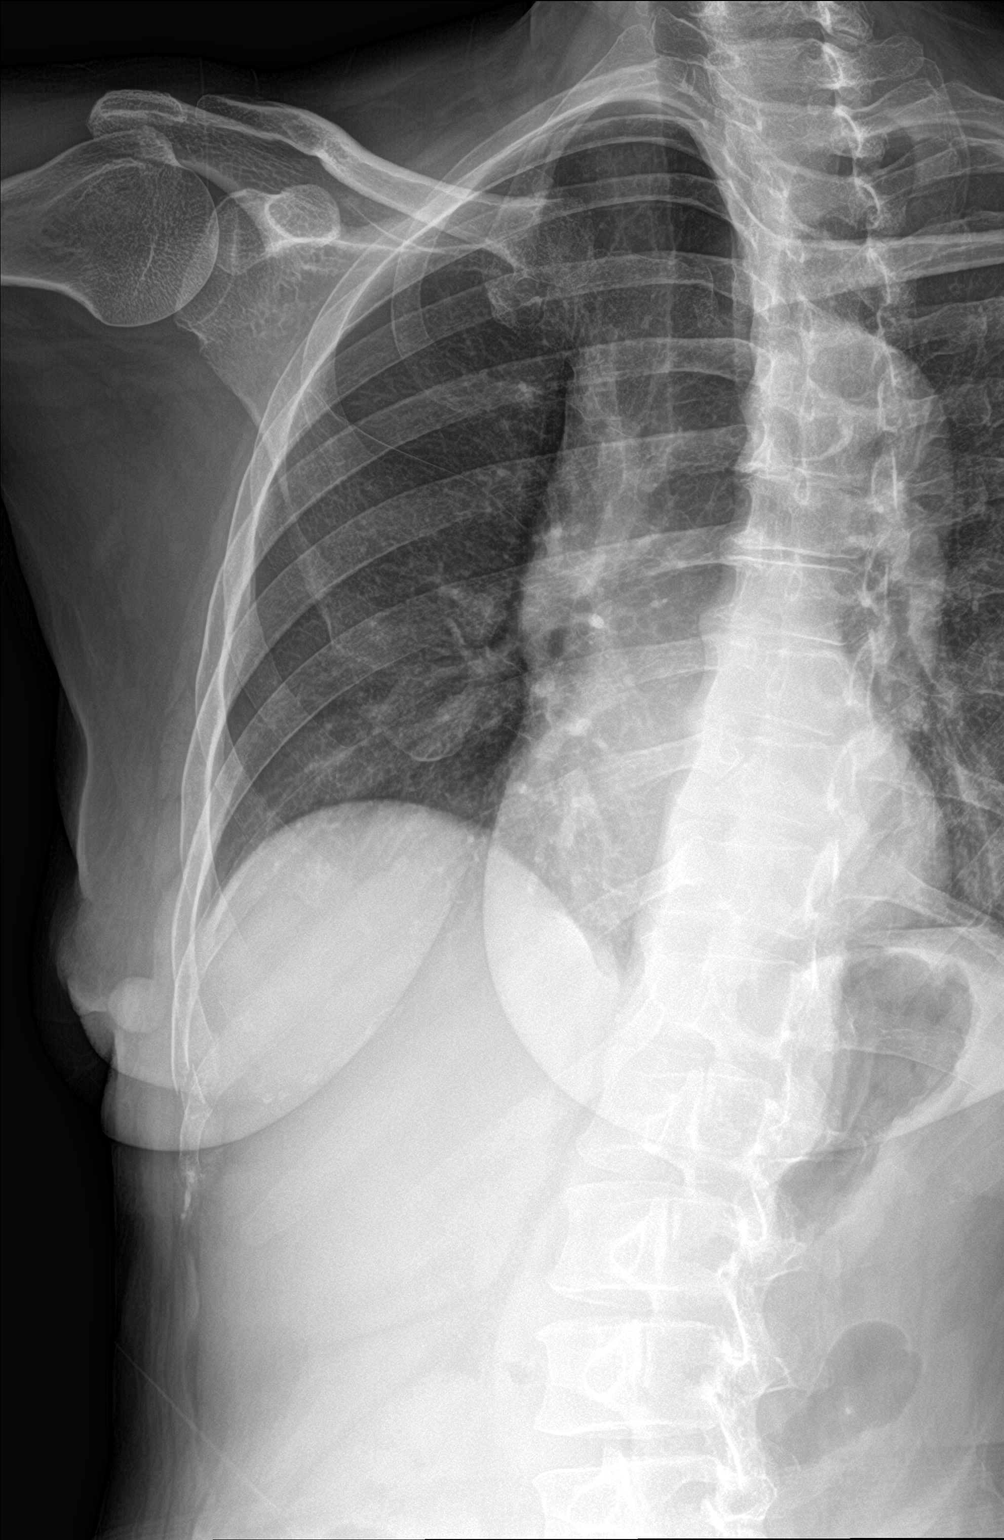

[3 of 3 positions shown; findings below may reference images not displayed]

FINDINGS: Acute minimally displaced fractures involving the lateral aspect of
the right ninth and tenth ribs. There is no evidence of pneumothorax
or pleural effusion. Both lungs are clear. Heart size and
mediastinal contours are within normal limits. Aortic
atherosclerosis.
IMPRESSION: 1. Acute minimally displaced fractures involving the lateral aspect
of the right ninth and tenth ribs.
2. No pneumothorax.

## 2022-06-03 ENCOUNTER — Other Ambulatory Visit: Payer: Self-pay | Admitting: Internal Medicine

## 2022-06-04 LAB — BASIC METABOLIC PANEL WITH GFR
BUN: 9 mg/dL (ref 7–25)
CO2: 26 mmol/L (ref 20–32)
Calcium: 9.2 mg/dL (ref 8.6–10.4)
Chloride: 95 mmol/L — ABNORMAL LOW (ref 98–110)
Creat: 0.64 mg/dL (ref 0.50–1.05)
Glucose, Bld: 125 mg/dL — ABNORMAL HIGH (ref 65–99)
Potassium: 3.5 mmol/L (ref 3.5–5.3)
Sodium: 137 mmol/L (ref 135–146)
eGFR: 98 mL/min/{1.73_m2} (ref >=60)

## 2022-06-04 LAB — CBC
HCT: 33 % — ABNORMAL LOW (ref 35.0–45.0)
Hemoglobin: 11.9 g/dL (ref 11.7–15.5)
MCH: 38.6 pg — ABNORMAL HIGH (ref 27.0–33.0)
MCHC: 36.1 g/dL — ABNORMAL HIGH (ref 32.0–36.0)
MCV: 107.1 fL — ABNORMAL HIGH (ref 80.0–100.0)
MPV: 10.8 fL (ref 7.5–12.5)
Platelets: 207 10*3/uL (ref 140–400)
RBC: 3.08 10*6/uL — ABNORMAL LOW (ref 3.80–5.10)
RDW: 16.7 % — ABNORMAL HIGH (ref 11.0–15.0)
WBC: 3.4 10*3/uL — ABNORMAL LOW (ref 3.8–10.8)

## 2022-06-12 ENCOUNTER — Other Ambulatory Visit: Payer: Self-pay

## 2022-06-12 ENCOUNTER — Emergency Department (HOSPITAL_COMMUNITY): Payer: Medicare Other

## 2022-06-12 ENCOUNTER — Inpatient Hospital Stay (HOSPITAL_COMMUNITY)
Admission: EM | Admit: 2022-06-12 | Discharge: 2022-06-15 | DRG: 683 | Disposition: A | Discharge status: Home / Self Care | Payer: Medicare Other | Attending: Internal Medicine | Admitting: Internal Medicine

## 2022-06-12 ENCOUNTER — Encounter (HOSPITAL_COMMUNITY): Payer: Self-pay

## 2022-06-12 DIAGNOSIS — D696 Thrombocytopenia, unspecified: Secondary | ICD-10-CM | POA: Diagnosis present

## 2022-06-12 DIAGNOSIS — E878 Other disorders of electrolyte and fluid balance, not elsewhere classified: Secondary | ICD-10-CM | POA: Diagnosis present

## 2022-06-12 DIAGNOSIS — Z7141 Alcohol abuse counseling and surveillance of alcoholic: Secondary | ICD-10-CM

## 2022-06-12 DIAGNOSIS — R7989 Other specified abnormal findings of blood chemistry: Secondary | ICD-10-CM | POA: Diagnosis not present

## 2022-06-12 DIAGNOSIS — D7589 Other specified diseases of blood and blood-forming organs: Secondary | ICD-10-CM | POA: Diagnosis present

## 2022-06-12 DIAGNOSIS — R55 Syncope and collapse: Secondary | ICD-10-CM | POA: Diagnosis not present

## 2022-06-12 DIAGNOSIS — N179 Acute kidney failure, unspecified: Secondary | ICD-10-CM | POA: Diagnosis not present

## 2022-06-12 DIAGNOSIS — R159 Full incontinence of feces: Secondary | ICD-10-CM | POA: Diagnosis present

## 2022-06-12 DIAGNOSIS — K760 Fatty (change of) liver, not elsewhere classified: Secondary | ICD-10-CM | POA: Diagnosis present

## 2022-06-12 DIAGNOSIS — D539 Nutritional anemia, unspecified: Secondary | ICD-10-CM | POA: Diagnosis present

## 2022-06-12 DIAGNOSIS — E876 Hypokalemia: Secondary | ICD-10-CM | POA: Diagnosis present

## 2022-06-12 DIAGNOSIS — R197 Diarrhea, unspecified: Secondary | ICD-10-CM | POA: Diagnosis present

## 2022-06-12 DIAGNOSIS — N390 Urinary tract infection, site not specified: Principal | ICD-10-CM | POA: Diagnosis present

## 2022-06-12 DIAGNOSIS — Z79899 Other long term (current) drug therapy: Secondary | ICD-10-CM

## 2022-06-12 DIAGNOSIS — R42 Dizziness and giddiness: Secondary | ICD-10-CM

## 2022-06-12 DIAGNOSIS — R531 Weakness: Secondary | ICD-10-CM

## 2022-06-12 DIAGNOSIS — K828 Other specified diseases of gallbladder: Secondary | ICD-10-CM | POA: Diagnosis present

## 2022-06-12 DIAGNOSIS — K5790 Diverticulosis of intestine, part unspecified, without perforation or abscess without bleeding: Secondary | ICD-10-CM | POA: Diagnosis present

## 2022-06-12 DIAGNOSIS — F1010 Alcohol abuse, uncomplicated: Secondary | ICD-10-CM | POA: Diagnosis present

## 2022-06-12 DIAGNOSIS — R251 Tremor, unspecified: Secondary | ICD-10-CM | POA: Diagnosis present

## 2022-06-12 DIAGNOSIS — K7010 Alcoholic hepatitis without ascites: Secondary | ICD-10-CM | POA: Diagnosis present

## 2022-06-12 DIAGNOSIS — G4700 Insomnia, unspecified: Secondary | ICD-10-CM | POA: Diagnosis present

## 2022-06-12 DIAGNOSIS — D72819 Decreased white blood cell count, unspecified: Secondary | ICD-10-CM | POA: Diagnosis present

## 2022-06-12 LAB — URINALYSIS, ROUTINE W REFLEX MICROSCOPIC
Glucose, UA: 50 mg/dL — AB
Hgb urine dipstick: NEGATIVE
Ketones, ur: 20 mg/dL — AB
Leukocytes,Ua: NEGATIVE
Nitrite: POSITIVE — AB
Protein, ur: 100 mg/dL — AB
Specific Gravity, Urine: 1.023 (ref 1.005–1.030)
pH: 6 (ref 5.0–8.0)

## 2022-06-12 LAB — COMPREHENSIVE METABOLIC PANEL
ALT: 174 U/L — ABNORMAL HIGH (ref 0–44)
AST: 573 U/L — ABNORMAL HIGH (ref 15–41)
Albumin: 4.3 g/dL (ref 3.5–5.0)
Alkaline Phosphatase: 182 U/L — ABNORMAL HIGH (ref 38–126)
Anion gap: 20 — ABNORMAL HIGH (ref 5–15)
BUN: 17 mg/dL (ref 8–23)
CO2: 22 mmol/L (ref 22–32)
Calcium: 9.3 mg/dL (ref 8.9–10.3)
Chloride: 90 mmol/L — ABNORMAL LOW (ref 98–111)
Creatinine, Ser: 2.15 mg/dL — ABNORMAL HIGH (ref 0.44–1.00)
GFR, Estimated: 25 mL/min — ABNORMAL LOW (ref >60)
Glucose, Bld: 102 mg/dL — ABNORMAL HIGH (ref 70–99)
Potassium: 4.6 mmol/L (ref 3.5–5.1)
Sodium: 132 mmol/L — ABNORMAL LOW (ref 135–145)
Total Bilirubin: 1.8 mg/dL — ABNORMAL HIGH (ref 0.3–1.2)
Total Protein: 8 g/dL (ref 6.5–8.1)

## 2022-06-12 LAB — CBC WITH DIFFERENTIAL/PLATELET
Abs Immature Granulocytes: 0.01 10*3/uL (ref 0.00–0.07)
Basophils Absolute: 0 10*3/uL (ref 0.0–0.1)
Basophils Relative: 1 %
Eosinophils Absolute: 0 10*3/uL (ref 0.0–0.5)
Eosinophils Relative: 0 %
HCT: 36.7 % (ref 36.0–46.0)
Hemoglobin: 13.1 g/dL (ref 12.0–15.0)
Immature Granulocytes: 0 %
Lymphocytes Relative: 19 %
Lymphs Abs: 0.8 10*3/uL (ref 0.7–4.0)
MCH: 39.1 pg — ABNORMAL HIGH (ref 26.0–34.0)
MCHC: 35.7 g/dL (ref 30.0–36.0)
MCV: 109.6 fL — ABNORMAL HIGH (ref 80.0–100.0)
Monocytes Absolute: 0.2 10*3/uL (ref 0.1–1.0)
Monocytes Relative: 5 %
Neutro Abs: 3.4 10*3/uL (ref 1.7–7.7)
Neutrophils Relative %: 75 %
Platelets: 211 10*3/uL (ref 150–400)
RBC: 3.35 MIL/uL — ABNORMAL LOW (ref 3.87–5.11)
RDW: 16.1 % — ABNORMAL HIGH (ref 11.5–15.5)
WBC: 4.5 10*3/uL (ref 4.0–10.5)
nRBC: 0 % (ref 0.0–0.2)

## 2022-06-12 MED ORDER — SODIUM CHLORIDE 0.9 % IV SOLN
1.0000 g | Freq: Once | INTRAVENOUS | Status: AC
  Administered 2022-06-13: 1 g via INTRAVENOUS
  Filled 2022-06-12: qty 10

## 2022-06-12 MED ORDER — SODIUM CHLORIDE 0.9 % IV BOLUS
500.0000 mL | Freq: Once | INTRAVENOUS | Status: AC
  Administered 2022-06-13: 500 mL via INTRAVENOUS

## 2022-06-12 MED ORDER — THIAMINE HCL 100 MG/ML IJ SOLN
100.0000 mg | Freq: Once | INTRAMUSCULAR | Status: AC
  Administered 2022-06-13: 100 mg via INTRAVENOUS
  Filled 2022-06-12: qty 2

## 2022-06-12 MED ORDER — SODIUM CHLORIDE 0.9 % IV SOLN: INTRAVENOUS | Status: DC

## 2022-06-12 NOTE — Code Documentation (Signed)
Responded to Code Stroke called at 2253 on pt already in ED for weakness, poor appetite, and diarrhea, Code Stroke called for weakness, [id number]. NIH-0 on assessment. TNK not given-NIH-0. Plan for STAT head CT.

## 2022-06-12 NOTE — ED Provider Notes (Signed)
[facility] Provider Note   CSN: [id number] Arrival date & time: 06/12/22  2122  An emergency department physician performed an initial assessment on this suspected stroke patient at 2253.  History {Add pertinent medical, surgical, social history, OB history to HPI:1} Chief Complaint  Patient presents with   Code Stroke    Stacey Oconnor is a 67 y.o. female.  HPI     Home Medications Prior to Admission medications   Medication Sig Start Date End Date Taking? Authorizing Provider  acetaminophen (TYLENOL) 500 MG tablet Take 500 mg by mouth every 6 (six) hours as needed for mild pain.    [provider]  aspirin 81 MG chewable tablet Chew 1 tablet (81 mg total) by mouth 2 (two) times a day. 09/27/18   Meredith Pel, MD  Multiple Vitamin (MULTIVITAMIN WITH MINERALS) TABS tablet Take 1 tablet by mouth daily.    [provider]  tetrahydrozoline 0.05 % ophthalmic solution Place 1 drop into both eyes daily as needed (dry eyes).    [provider]      Allergies    Patient has no known allergies.    Review of Systems   Review of Systems  Physical Exam Updated Vital Signs BP 121/80   Pulse (!) 111   Temp 97.9 F (36.6 C) (Oral)   Resp 20   Ht 5' 6"$  (1.676 m)   Wt 76.7 kg   SpO2 100%   BMI 27.28 kg/m  Physical Exam  ED Results / Procedures / Treatments   Labs (all labs ordered are listed, but only abnormal results are displayed) Labs Reviewed  CBC WITH DIFFERENTIAL/PLATELET - Abnormal; Notable for the following components:      Result Value   RBC 3.35 (*)    MCV 109.6 (*)    MCH 39.1 (*)    RDW 16.1 (*)    All other components within normal limits  URINALYSIS, ROUTINE W REFLEX MICROSCOPIC - Abnormal; Notable for the following components:   Color, Urine AMBER (*)    APPearance CLOUDY (*)    Glucose, UA 50 (*)    Bilirubin Urine MODERATE (*)    Ketones, ur 20 (*)    Protein, ur 100 (*)     Nitrite POSITIVE (*)    Bacteria, UA MANY (*)    All other components within normal limits  COMPREHENSIVE METABOLIC PANEL  ETHANOL  PROTIME-INR  APTT  RAPID URINE DRUG SCREEN, HOSP PERFORMED  I-STAT CHEM 8, ED    EKG EKG Interpretation  Date/Time:  Wednesday June 12 2022 21:30:23 EST Ventricular Rate:  123 PR Interval:  156 QRS Duration: 58 QT Interval:  324 QTC Calculation: 463 R Axis:   38 Text Interpretation: Sinus tachycardia Cannot rule out Anterior infarct , age undetermined Abnormal ECG No significant change since last tracing Confirmed by Deno Etienne [phone]) on 06/12/2022 9:39:46 PM  Radiology No results found.  Procedures Procedures  {Document cardiac monitor, telemetry assessment procedure when appropriate:1}  Medications Ordered in ED Medications - No data to display  ED Course/ Medical Decision Making/ A&P   {   Click here for ABCD2, HEART and other calculatorsREFRESH Note before signing :1}                          Medical Decision Making Amount and/or Complexity of Data Reviewed Labs: ordered.   ***  {Document critical care time when appropriate:1} {Document review of  labs and clinical decision tools ie heart score, Chads2Vasc2 etc:1}  {Document your independent review of radiology images, and any outside records:1} {Document your discussion with family members, caretakers, and with consultants:1} {Document social determinants of health affecting pt's care:1} {Document your decision making why or why not admission, treatments were needed:1} Final Clinical Impression(s) / ED Diagnoses Final diagnoses:  None    Rx / DC Orders ED Discharge Orders     None

## 2022-06-12 NOTE — ED Triage Notes (Signed)
Arrives EMS from playing [place]. Lives at home. ~6PM began having generalized weakness, poor appetite, and diarrhea.

## 2022-06-12 NOTE — Consult Note (Signed)
NEUROLOGY CONSULTATION NOTE   Date of service: June 12, 2022 Patient Name: Stacey Oconnor MRN:  [id number] DOB:  16-Nov-1955 Reason for consult: "stroke code for tremors, dizziness" Requesting Provider: No att. providers found _ _ _   _ __   _ __ _ _  __ __   _ __   __ _  History of Present Illness  Stacey Oconnor is a 67 y.o. female with PMH significant for ankle fx who reports trembling for the last 2 weeks. Today in PM, was playing bingo, had some diarrhea, trembling and feeling dizzy like she is going to passout.  Reports not been feeling well over the last few days. She came in to the ED today as she felt off and felt like she was going to pass out. She is having a hard time describing how she is feeling.  No prior hx of strokes. A code stroke was activated in the ED for concern for tremors, dizziness with a LKW of 1800.  LKW: 06/12/22 @ 1800 mRS: 0 tNKASE: not offered, no symptoms and low suspicion for stroke. Thrombectomy: not offered, no focal deficit and low suspicion for stroke. NIHSS components Score: Comment  1a Level of Conscious 0[x]$  1[]$  2[]$  3[]$      1b LOC Questions 0[x]$  1[]$  2[]$       1c LOC Commands 0[x]$  1[]$  2[]$       2 Best Gaze 0[x]$  1[]$  2[]$       3 Visual 0[x]$  1[]$  2[]$  3[]$      4 Facial Palsy 0[x]$  1[]$  2[]$  3[]$      5a Motor Arm - left 0[x]$  1[]$  2[]$  3[]$  4[]$  UN[]$    5b Motor Arm - Right 0[x]$  1[]$  2[]$  3[]$  4[]$  UN[]$    6a Motor Leg - Left 0[x]$  1[]$  2[]$  3[]$  4[]$  UN[]$    6b Motor Leg - Right 0[x]$  1[]$  2[]$  3[]$  4[]$  UN[]$    7 Limb Ataxia 0[x]$  1[]$  2[]$  3[]$  UN[]$     8 Sensory 0[x]$  1[]$  2[]$  UN[]$      9 Best Language 0[x]$  1[]$  2[]$  3[]$      10 Dysarthria 0[x]$  1[]$  2[]$  UN[]$      11 Extinct. and Inattention 0[x]$  1[]$  2[]$       TOTAL: 0      ROS   Constitutional Denies weight loss, fever and chills.   HEENT Denies changes in vision and hearing.   Respiratory Denies SOB and cough.   CV Denies palpitations and CP   GI + abdominal pain, nausea, vomiting and diarrhea.   GU Denies dysuria and urinary  frequency.   MSK Denies myalgia and joint pain.   Skin Denies rash and pruritus.   Neurological Denies headache but feels presyncopal  Psychiatric Denies recent changes in mood. Denies anxiety and depression.    Past History   Past Medical History:  Diagnosis Date   Ankle fracture    Left   Past Surgical History:  Procedure Laterality Date   ABSCESS DRAINAGE     Left Buttock   ORIF ANKLE FRACTURE Left 09/26/2018   Procedure: OPEN REDUCTION INTERNAL FIXATION (ORIF) ANKLE FRACTURE;  Surgeon: Meredith Pel, MD;  Location: [facility];  Service: Orthopedics;  Laterality: Left;   History reviewed. No pertinent family history. Social History   Socioeconomic History   Marital status: Divorced    Spouse name: Not on file   Number of children: Not on file   Years of education: Not on file   Highest education level: Not on file  Occupational History   Not on  file  Tobacco Use   Smoking status: Never   Smokeless tobacco: Never  Vaping Use   Vaping Use: Never used  Substance and Sexual Activity   Alcohol use: Yes    Comment: occasional   Drug use: Never   Sexual activity: Not on file  Other Topics Concern   Not on file  Social History Narrative   Not on file   Social Determinants of Health   Financial Resource Strain: Not on file  Food Insecurity: Not on file  Transportation Needs: Not on file  Physical Activity: Not on file  Stress: Not on file  Social Connections: Not on file   No Known Allergies  Medications  (Not in a hospital admission)    Vitals   Vitals:   06/12/22 2125 06/12/22 2126  BP: 121/80   Pulse: (!) 111   Resp: 20   Temp: 97.9 F (36.6 C)   TempSrc: Oral   SpO2: 100%   Weight:  76.7 kg  Height:  5' 6"$  (1.676 m)     Body mass index is 27.28 kg/m.  Physical Exam   General: slow and trembling and somewhat anxious. Laying comfortably in bed; in no acute distress.  HENT: Normal oropharynx and mucosa. Normal external appearance of ears  and nose.  Neck: Supple, no pain or tenderness  CV: No JVD. No peripheral edema.  Pulmonary: Symmetric Chest rise. Normal respiratory effort.  Abdomen: Soft to touch, non-tender.  Ext: No cyanosis, edema, or deformity  Skin: No rash. Normal palpation of skin.   Musculoskeletal: Normal digits and nails by inspection. No clubbing.   Neurologic Examination  Mental status/Cognition: Alert, oriented to self, place, month and year, good attention.  Speech/language: Fluent, comprehension intact, object naming intact, repetition intact.  Cranial nerves:   CN II Pupils equal and reactive to light, no VF deficits    CN III,IV,VI EOM intact, no gaze preference or deviation, no nystagmus    CN V normal sensation in V1, V2, and V3 segments bilaterally    CN VII no asymmetry, no nasolabial fold flattening    CN VIII normal hearing to speech    CN IX & X normal palatal elevation, no uvular deviation    CN XI 5/5 head turn and 5/5 shoulder shrug bilaterally    CN XII midline tongue protrusion    Motor:  Muscle bulk: poor, tone normal, pronator drift none. Trembling. Mvmt Root Nerve  Muscle Right Left Comments  SA C5/6 Ax Deltoid 5 5   EF C5/6 Mc Biceps 5 5   EE C6/7/8 Rad Triceps 5 5   WF C6/7 Med FCR     WE C7/8 PIN ECU     F Ab C8/T1 U ADM/FDI 5 5   HF L1/2/3 Fem Illopsoas 5 5   KE L2/3/4 Fem Quad 5 5   DF L4/5 D Peron Tib Ant 5 5   PF S1/2 Tibial Grc/Sol 5 5    Sensation:  Light touch Intact throughout   Pin prick    Temperature    Vibration   Proprioception    Coordination/Complex Motor:  - Finger to Nose intact BL - Heel to shin intact BL - Rapid alternating movement are normal - Gait: Stride length short. Arm swing poor. Base width narrow. Trembling. Unable to toe walk.  Labs   CBC:  Recent Labs  Lab 06/12/22 2238  WBC 4.5  NEUTROABS 3.4  HGB 13.1  HCT 36.7  MCV 109.6*  PLT 211  Basic Metabolic Panel:  Lab Results  Component Value Date   NA 137 06/03/2022    K 3.5 06/03/2022   CO2 26 06/03/2022   GLUCOSE 125 (H) 06/03/2022   BUN 9 06/03/2022   CREATININE 0.64 06/03/2022   CALCIUM 9.2 06/03/2022   GFRNONAA >60 12/14/2021   Lipid Panel:  Lab Results  Component Value Date   LDLCALC 108 (H) 02/13/2021   HgbA1c: No results found for: "HGBA1C" Urine Drug Screen:     Component Value Date/Time   LABOPIA NONE DETECTED 12/14/2021 0644   [place] DETECTED 12/14/2021 0644   LABBENZ NONE DETECTED 12/14/2021 0644   AMPHETMU NONE DETECTED 12/14/2021 [place] DETECTED 12/14/2021 0644   LABBARB NONE DETECTED 12/14/2021 0644    Alcohol Level     Component Value Date/Time   ETH <10 12/14/2021 0605    CT Head without contrast(Personally reviewed): CTH was negative for a large hypodensity concerning for a large territory infarct or hyperdensity concerning for an ICH   Impression   Maevis Dumitrescu is a 67 y.o. female with PMH significant for with PMH significant for ankle fx who reports trembling for the last 2 weeks. Today in PM, was playing bingo, had some diarrhea, trembling and feeling dizzy like she is going to passout.  Episode appears more presyncopal rather than an acute stroke. Specially in the absence of any focal deficit.  Will get a CT Head but I would recommend metabolic workup. If that is unrevealing, can consider getting further imaging of head with MRI Brain but I personally think MRI would be low yield.  Overall, low suspicion for stroke or acute neurological process. Suspect that current presentation is likely secondary to potential cardiovascular or metabolic abnormalities.  Recommendations  - MRI only if no other etiology of her presyncopal episode. Consetallation of her symptoms including diarrrhea, presycnopal and termbling are less concerning for an acute neurological process and more for a potential cardiovascular or metabolic  abnormalities. ______________________________________________________________________   Thank you for the opportunity to take part in the care of this patient. If you have any further questions, please contact the neurology consultation attending.  Signed,  [place] Pager Number [phone] _ _ _   _ __   _ __ _ _  __ __   _ __   __ _

## 2022-06-13 ENCOUNTER — Emergency Department (HOSPITAL_COMMUNITY): Payer: Medicare Other

## 2022-06-13 ENCOUNTER — Encounter (HOSPITAL_COMMUNITY): Payer: Self-pay | Admitting: Internal Medicine

## 2022-06-13 DIAGNOSIS — N390 Urinary tract infection, site not specified: Secondary | ICD-10-CM | POA: Diagnosis present

## 2022-06-13 DIAGNOSIS — Z79899 Other long term (current) drug therapy: Secondary | ICD-10-CM | POA: Diagnosis not present

## 2022-06-13 DIAGNOSIS — K760 Fatty (change of) liver, not elsewhere classified: Secondary | ICD-10-CM | POA: Diagnosis present

## 2022-06-13 DIAGNOSIS — R531 Weakness: Secondary | ICD-10-CM

## 2022-06-13 DIAGNOSIS — D7589 Other specified diseases of blood and blood-forming organs: Secondary | ICD-10-CM | POA: Diagnosis present

## 2022-06-13 DIAGNOSIS — D696 Thrombocytopenia, unspecified: Secondary | ICD-10-CM | POA: Diagnosis present

## 2022-06-13 DIAGNOSIS — E876 Hypokalemia: Secondary | ICD-10-CM | POA: Diagnosis present

## 2022-06-13 DIAGNOSIS — R7989 Other specified abnormal findings of blood chemistry: Secondary | ICD-10-CM | POA: Diagnosis present

## 2022-06-13 DIAGNOSIS — E878 Other disorders of electrolyte and fluid balance, not elsewhere classified: Secondary | ICD-10-CM | POA: Diagnosis present

## 2022-06-13 DIAGNOSIS — R55 Syncope and collapse: Secondary | ICD-10-CM | POA: Diagnosis present

## 2022-06-13 DIAGNOSIS — R197 Diarrhea, unspecified: Secondary | ICD-10-CM | POA: Diagnosis present

## 2022-06-13 DIAGNOSIS — K7010 Alcoholic hepatitis without ascites: Secondary | ICD-10-CM | POA: Diagnosis present

## 2022-06-13 DIAGNOSIS — R251 Tremor, unspecified: Secondary | ICD-10-CM | POA: Diagnosis present

## 2022-06-13 DIAGNOSIS — G4700 Insomnia, unspecified: Secondary | ICD-10-CM | POA: Diagnosis present

## 2022-06-13 DIAGNOSIS — K828 Other specified diseases of gallbladder: Secondary | ICD-10-CM | POA: Diagnosis present

## 2022-06-13 DIAGNOSIS — K700 Alcoholic fatty liver: Secondary | ICD-10-CM | POA: Diagnosis not present

## 2022-06-13 DIAGNOSIS — N179 Acute kidney failure, unspecified: Secondary | ICD-10-CM | POA: Diagnosis present

## 2022-06-13 DIAGNOSIS — F1010 Alcohol abuse, uncomplicated: Secondary | ICD-10-CM | POA: Diagnosis present

## 2022-06-13 DIAGNOSIS — D539 Nutritional anemia, unspecified: Secondary | ICD-10-CM | POA: Diagnosis present

## 2022-06-13 DIAGNOSIS — K5790 Diverticulosis of intestine, part unspecified, without perforation or abscess without bleeding: Secondary | ICD-10-CM | POA: Diagnosis present

## 2022-06-13 DIAGNOSIS — Z7141 Alcohol abuse counseling and surveillance of alcoholic: Secondary | ICD-10-CM | POA: Diagnosis not present

## 2022-06-13 DIAGNOSIS — R159 Full incontinence of feces: Secondary | ICD-10-CM | POA: Diagnosis present

## 2022-06-13 DIAGNOSIS — D72819 Decreased white blood cell count, unspecified: Secondary | ICD-10-CM | POA: Diagnosis present

## 2022-06-13 LAB — I-STAT CHEM 8, ED
BUN: 23 mg/dL (ref 8–23)
Calcium, Ion: 1.01 mmol/L — ABNORMAL LOW (ref 1.15–1.40)
Chloride: 93 mmol/L — ABNORMAL LOW (ref 98–111)
Creatinine, Ser: 2.1 mg/dL — ABNORMAL HIGH (ref 0.44–1.00)
Glucose, Bld: 86 mg/dL (ref 70–99)
HCT: 39 % (ref 36.0–46.0)
Hemoglobin: 13.3 g/dL (ref 12.0–15.0)
Potassium: 4.4 mmol/L (ref 3.5–5.1)
Sodium: 132 mmol/L — ABNORMAL LOW (ref 135–145)
TCO2: 28 mmol/L (ref 22–32)

## 2022-06-13 LAB — ETHANOL: Alcohol, Ethyl (B): <10 mg/dL (ref <10)

## 2022-06-13 LAB — HEPATITIS PANEL, ACUTE
HCV Ab: NONREACTIVE
Hep A IgM: NONREACTIVE
Hep B C IgM: NONREACTIVE
Hepatitis B Surface Ag: NONREACTIVE

## 2022-06-13 LAB — APTT: aPTT: 27 seconds (ref 24–36)

## 2022-06-13 LAB — PROTIME-INR
INR: 1 (ref 0.8–1.2)
Prothrombin Time: 13.2 seconds (ref 11.4–15.2)

## 2022-06-13 LAB — HIV ANTIBODY (ROUTINE TESTING W REFLEX): HIV Screen 4th Generation wRfx: NONREACTIVE

## 2022-06-13 MED ORDER — ADULT MULTIVITAMIN W/MINERALS CH
1.0000 | ORAL_TABLET | Freq: Every day | ORAL | Status: DC
  Administered 2022-06-13 – 2022-06-15 (×3): 1 via ORAL
  Filled 2022-06-13 (×3): qty 1

## 2022-06-13 MED ORDER — SODIUM CHLORIDE 0.9 % IV SOLN: INTRAVENOUS | Status: DC

## 2022-06-13 MED ORDER — MORPHINE SULFATE (PF) 2 MG/ML IV SOLN: 2.0000 mg | INTRAVENOUS | Status: DC | PRN

## 2022-06-13 MED ORDER — THIAMINE HCL 100 MG/ML IJ SOLN
100.0000 mg | Freq: Every day | INTRAMUSCULAR | Status: DC
  Filled 2022-06-13 (×2): qty 2

## 2022-06-13 MED ORDER — LORAZEPAM 1 MG PO TABS: 1.0000 mg | ORAL_TABLET | ORAL | Status: DC | PRN

## 2022-06-13 MED ORDER — FOLIC ACID 1 MG PO TABS
1.0000 mg | ORAL_TABLET | Freq: Every day | ORAL | Status: DC
  Administered 2022-06-13 – 2022-06-15 (×3): 1 mg via ORAL
  Filled 2022-06-13 (×3): qty 1

## 2022-06-13 MED ORDER — LORAZEPAM 2 MG/ML IJ SOLN
0.0000 mg | Freq: Four times a day (QID) | INTRAMUSCULAR | Status: DC
  Administered 2022-06-13: 1 mg via INTRAVENOUS
  Filled 2022-06-13: qty 1

## 2022-06-13 MED ORDER — ONDANSETRON HCL 4 MG PO TABS: 4.0000 mg | ORAL_TABLET | Freq: Four times a day (QID) | ORAL | Status: DC | PRN

## 2022-06-13 MED ORDER — LORAZEPAM 1 MG PO TABS: 0.0000 mg | ORAL_TABLET | Freq: Two times a day (BID) | ORAL | Status: DC

## 2022-06-13 MED ORDER — BISACODYL 5 MG PO TBEC: 5.0000 mg | DELAYED_RELEASE_TABLET | Freq: Every day | ORAL | Status: DC | PRN

## 2022-06-13 MED ORDER — LORAZEPAM 1 MG PO TABS: 0.0000 mg | ORAL_TABLET | Freq: Four times a day (QID) | ORAL | Status: DC

## 2022-06-13 MED ORDER — OXYCODONE HCL 5 MG PO TABS: 5.0000 mg | ORAL_TABLET | ORAL | Status: DC | PRN

## 2022-06-13 MED ORDER — LORAZEPAM 2 MG/ML IJ SOLN: 0.0000 mg | Freq: Two times a day (BID) | INTRAMUSCULAR | Status: DC

## 2022-06-13 MED ORDER — SODIUM CHLORIDE 0.9 % IV SOLN
1.0000 g | INTRAVENOUS | Status: DC
  Administered 2022-06-13 – 2022-06-14 (×2): 1 g via INTRAVENOUS
  Filled 2022-06-13 (×2): qty 10

## 2022-06-13 MED ORDER — ENOXAPARIN SODIUM 30 MG/0.3ML IJ SOSY
30.0000 mg | PREFILLED_SYRINGE | INTRAMUSCULAR | Status: DC
  Administered 2022-06-13 – 2022-06-14 (×2): 30 mg via SUBCUTANEOUS
  Filled 2022-06-13 (×2): qty 0.3

## 2022-06-13 MED ORDER — POLYETHYLENE GLYCOL 3350 17 G PO PACK: 17.0000 g | PACK | Freq: Every day | ORAL | Status: DC | PRN

## 2022-06-13 MED ORDER — LORAZEPAM 2 MG/ML IJ SOLN: 1.0000 mg | INTRAMUSCULAR | Status: DC | PRN

## 2022-06-13 MED ORDER — DOCUSATE SODIUM 100 MG PO CAPS
100.0000 mg | ORAL_CAPSULE | Freq: Two times a day (BID) | ORAL | Status: DC
  Administered 2022-06-13 – 2022-06-14 (×2): 100 mg via ORAL
  Filled 2022-06-13 (×3): qty 1

## 2022-06-13 MED ORDER — HYDRALAZINE HCL 20 MG/ML IJ SOLN: 5.0000 mg | INTRAMUSCULAR | Status: DC | PRN

## 2022-06-13 MED ORDER — THIAMINE MONONITRATE 100 MG PO TABS
100.0000 mg | ORAL_TABLET | Freq: Every day | ORAL | Status: DC
  Administered 2022-06-13 – 2022-06-15 (×3): 100 mg via ORAL
  Filled 2022-06-13 (×3): qty 1

## 2022-06-13 MED ORDER — ONDANSETRON HCL 4 MG/2ML IJ SOLN: 4.0000 mg | Freq: Four times a day (QID) | INTRAMUSCULAR | Status: DC | PRN

## 2022-06-13 NOTE — ED Notes (Signed)
Per charge RN Krisit, CODE STROKE CANCEL

## 2022-06-13 NOTE — H&P (Signed)
History and Physical    Patient: Stacey Oconnor [id number] DOB: 02/27/1956 DOA: 06/12/2022 DOS: the patient was seen and examined on 06/13/2022 PCP: Nolene Ebbs, MD  Patient coming from: Home - lives alone; NOK: Chauncy Lean, [phone]   Chief Complaint: Weakness  HPI: Stacey Oconnor is a 67 y.o. female without significant medical history presenting with weakness.  She felt weak and lightheaded starting about 6pm yesterday.  She ran errands through the day yesterday without difficulty.  She feels shaky all over.  No n/w/t or headache. No visual disturbance.  No dysphagia or dysarthria.  She had this type of issue once before  - that time she had a syncopal episode at [employer] after having been in the hospital before that and so she did not come in.  No urinary symptoms.  No abdominal pain or n/v.  She had 1 episode of diarrhea yesterday. Decreased appetite recently, hasn't been eating well.      ER Course:  Carryover, per Dr. Alcario Drought:  generalized weakness.  Onset at 6p.  Head CT neg, neurology canceled code stroke. Has UTI. Has LFTs very elevated, sounds like she drinks rather heavily though. Tremulous on exam but reports no h/o withdrawals previously. On rocephin for UTI.      Review of Systems: As mentioned in the history of present illness. All other systems reviewed and are negative. Past Medical History:  Diagnosis Date   Ankle fracture    Left   Past Surgical History:  Procedure Laterality Date   ABSCESS DRAINAGE     Left Buttock   ORIF ANKLE FRACTURE Left 09/26/2018   Procedure: OPEN REDUCTION INTERNAL FIXATION (ORIF) ANKLE FRACTURE;  Surgeon: Meredith Pel, MD;  Location: [facility];  Service: Orthopedics;  Laterality: Left;   Social History:  reports that she has never smoked. She has never used smokeless tobacco. She reports current alcohol use. She reports that she does not use drugs.  No Known Allergies  History reviewed. No pertinent family  history.  Prior to Admission medications   Medication Sig Start Date End Date Taking? Authorizing Provider  acetaminophen (TYLENOL) 500 MG tablet Take 500 mg by mouth every 6 (six) hours as needed for mild pain.   Yes [provider]  hydrOXYzine (ATARAX) 25 MG tablet Take 25 mg by mouth at bedtime as needed (for sleep). 05/31/22  Yes [provider]  Multiple Vitamin (MULTIVITAMIN WITH MINERALS) TABS tablet Take 1 tablet by mouth daily.   Yes [provider]  tetrahydrozoline 0.05 % ophthalmic solution Place 1 drop into both eyes daily as needed (dry eyes).   Yes [provider]  Vitamin D, Ergocalciferol, (DRISDOL) 1.25 MG (50000 UNIT) CAPS capsule Take 50,000 Units by mouth once a week. 03/29/22  Yes [provider]    Physical Exam: Vitals:   06/13/22 [id number] 06/13/22 0820 06/13/22 1136 06/13/22 1200  BP: 122/79   114/79  Pulse: 89   89  Resp:    18  Temp:  98.4 F (36.9 C) 97.9 F (36.6 C)   TempSrc:  Oral Oral   SpO2:    100%  Weight:      Height:       General:  Appears calm and comfortable and is in NAD Eyes:  EOMI, normal lids, iris ENT:  grossly normal hearing, lips & tongue, moderately dry mm Neck:  no LAD, masses or thyromegaly Cardiovascular:  RRR, no m/r/g. No LE edema.  Respiratory:   CTA bilaterally with no  wheezes/rales/rhonchi.  Normal respiratory effort. Abdomen:  soft, NT, ND Skin:  no rash or induration seen on limited exam Musculoskeletal:  grossly normal tone BUE/BLE, good ROM, no bony abnormality Psychiatric:  flat mood and affect, speech fluent and appropriate, AOx3 Neurologic:  CN 2-12 grossly intact, moves all extremities in coordinated fashion   Radiological Exams on Admission: Independently reviewed - see discussion in A/P where applicable  US Abdomen Limited  Result Date: 06/13/2022 CLINICAL DATA:  Right upper quadrant pain. EXAM: ULTRASOUND ABDOMEN LIMITED RIGHT UPPER QUADRANT COMPARISON:  06/13/2022.  FINDINGS: Gallbladder: No gallstones or wall thickening visualized. Sludge is present in the gallbladder. No sonographic Murphy sign noted by sonographer. Common bile duct: Diameter: 3.2 mm Liver: No focal lesion identified. Increased parenchymal echogenicity. Portal vein is patent on color Doppler imaging with normal direction of blood flow towards the liver. Other: No free fluid. IMPRESSION: 1. Gallbladder sludge with no acute cholecystitis. 2. Hepatic steatosis Electronically Signed   By: Brett Fairy M.D.   On: 06/13/2022 02:30   CT Renal Stone Study  Result Date: 06/13/2022 CLINICAL DATA:  Generalized weakness, poor appetite, and diarrhea. Abdominal/flank pain, stone suspected. EXAM: CT ABDOMEN AND PELVIS WITHOUT CONTRAST TECHNIQUE: Multidetector CT imaging of the abdomen and pelvis was performed following the standard protocol without IV contrast. RADIATION DOSE REDUCTION: This exam was performed according to the departmental dose-optimization program which includes automated exposure control, adjustment of the mA and/or kV according to patient size and/or use of iterative reconstruction technique. COMPARISON:  None Available. FINDINGS: Lower chest: No acute abnormality. Hepatobiliary: No focal liver abnormality is seen. Fatty infiltration of the liver is noted. No gallstones, gallbladder wall thickening, or biliary dilatation. Pancreas: Unremarkable. No pancreatic ductal dilatation or surrounding inflammatory changes. Spleen: Normal in size without focal abnormality. Adrenals/Urinary Tract: The adrenal glands are within normal limits. No renal calculus or hydronephrosis. There is mild bladder wall thickening. Stomach/Bowel: Stomach is within normal limits. Appendix appears normal. No evidence of bowel wall thickening, distention, or inflammatory changes. No free air or pneumatosis. Scattered diverticula are present along the colon without evidence of diverticulitis. Vascular/Lymphatic: Aortic  atherosclerosis. No enlarged abdominal or pelvic lymph nodes. Reproductive: The uterus has a lobular contour suggesting underlying fibroids. No adnexal mass. Other: No abdominopelvic ascites. Musculoskeletal: No acute osseous abnormality. IMPRESSION: 1. No acute intra-abdominal process. 2. Hepatic steatosis. 3. Diverticulosis without diverticulitis. 4. Uterine fibroids. 5. Aortic atherosclerosis. Electronically Signed   By: Brett Fairy M.D.   On: 06/13/2022 00:28   CT HEAD WO CONTRAST (5MM)  Result Date: 06/12/2022 CLINICAL DATA:  Syncope EXAM: CT HEAD WITHOUT CONTRAST TECHNIQUE: Contiguous axial images were obtained from the base of the skull through the vertex without intravenous contrast. RADIATION DOSE REDUCTION: This exam was performed according to the departmental dose-optimization program which includes automated exposure control, adjustment of the mA and/or kV according to patient size and/or use of iterative reconstruction technique. COMPARISON:  None Available. FINDINGS: Brain: No acute territorial infarction, hemorrhage or intracranial mass. Moderate atrophy. Minimal white matter hypodensity likely chronic small vessel ischemic change. Nonenlarged ventricles Vascular: No hyperdense vessels.  No unexpected calcification Skull: Normal. Negative for fracture or focal lesion. Sinuses/Orbits: No acute finding. Other: None IMPRESSION: 1. No CT evidence for acute intracranial abnormality. 2. Atrophy and minimal chronic small vessel ischemic change of the white matter. Electronically Signed   By: Donavan Foil M.D.   On: 06/12/2022 23:50    EKG: Independently reviewed.  Sinus tachycardia with rate 123; nonspecific ST  changes with no evidence of acute ischemia   Labs on Admission: I have personally reviewed the available labs and imaging studies at the time of the admission.  Pertinent labs:    Na++ 132 BUN 17/Creatinine 2.15/GFR 25 AP 182 AST 572/ALT 174/Bili 1.8 Unremarkable CBC INR 1.0 UA:  moderate bili, 50 glucose, 20 ketones, +nitrite, 100 protein, many bacteria ETOH <10   Assessment and Plan: Principal Problem:   Generalized weakness Active Problems:   UTI (urinary tract infection)   AKI (acute kidney injury) (HCC)   Elevated LFTs    Generalized weakness -Patient reports no chronic medical illness -She was in her usual state of health until last night when she became profoundly weak -Code stroke called but canceled -Evaluation thus far with UTI, AKI, and elevated LFTs -Possibly viral etiology connecting it all but this is not currently certain -Will admit to med surg and continue to evaluate -IVF hydration  UTI -UA is abnormal -Urine culture is pending -No specific symptoms to suggest UTI other than generalized weakness -Continue Rocephin for now  AKI -Normal renal function last week, now with frank AKI -Attempt to avoid nephrotoxic medications -Recheck CMP in AM   Elevated LFTs -Significantly elevated LFTs -Imaging unremarkable - gallbladder sludge without cholecystitis -Hydrating overnight, check hepatitis panel, CMV, EBV -Repeat CMP in AM -GI asked to see tomorrow   Advance Care Planning:   Code Status: Full Code - Code status was discussed with the patient at the time of admission.  The patient would want to receive full resuscitative measures at this time.   Consults: GI (tomorrow), neurology; TOC team; nutrition  DVT Prophylaxis: Lovenox  Family Communication: None present; patient declined having me call family at the time of admission  Severity of Illness: The appropriate patient status for this patient is INPATIENT. Inpatient status is judged to be reasonable and necessary in order to provide the required intensity of service to ensure the patient's safety. The patient's presenting symptoms, physical exam findings, and initial radiographic and laboratory data in the context of their chronic comorbidities is felt to place them at high risk  for further clinical deterioration. Furthermore, it is not anticipated that the patient will be medically stable for discharge from the hospital within 2 midnights of admission.   * I certify that at the point of admission it is my clinical judgment that the patient will require inpatient hospital care spanning beyond 2 midnights from the point of admission due to high intensity of service, high risk for further deterioration and high frequency of surveillance required.*  Author: Karmen Bongo, MD 06/13/2022 4:40 PM  For on call review [email or web address].

## 2022-06-13 NOTE — ED Notes (Signed)
ED TO INPATIENT HANDOFF REPORT  ED Nurse Name and Phone #: Leodis Liverpool   S Name/Age/Gender Stacey Oconnor 67 y.o. female Room/Bed: 005C/005C  Code Status   Code Status: Full Code  Home/SNF/Other Home Patient oriented to: self, place, time, and situation Is this baseline? Yes   Triage Complete: Triage complete  Chief Complaint Generalized weakness [R53.1]  Triage Note Arrives EMS from [place] [employer]. Lives at home. ~6PM began having generalized weakness, poor appetite, and diarrhea.    Allergies No Known Allergies  Level of Care/Admitting Diagnosis ED Disposition     ED Disposition  Admit   Condition  --   Comment  Hospital Area: [facility] [[id number]]  Level of Care: Med-Surg [16]  May admit patient to [facility] or [facility] if equivalent level of care is available:: Yes  Covid Evaluation: Asymptomatic - no recent exposure (last 10 days) testing not required  Diagnosis: Generalized weakness [id number]  Admitting Physician: Karmen Bongo [[id number]]  Attending Physician: Karmen Bongo [id number]  Certification:: I certify this patient will need inpatient services for at least 2 midnights  Estimated Length of Stay: 4          B Medical/Surgery History Past Medical History:  Diagnosis Date   Ankle fracture    Left   Past Surgical History:  Procedure Laterality Date   ABSCESS DRAINAGE     Left Buttock   ORIF ANKLE FRACTURE Left 09/26/2018   Procedure: OPEN REDUCTION INTERNAL FIXATION (ORIF) ANKLE FRACTURE;  Surgeon: Meredith Pel, MD;  Location: [facility];  Service: Orthopedics;  Laterality: Left;     A IV Location/Drains/Wounds Patient Lines/Drains/Airways Status     Active Line/Drains/Airways     Name Placement date Placement time Site Days   Peripheral IV 06/12/22 20 G Right Antecubital 06/12/22  2334  Antecubital  1   Incision (Closed) 09/26/18 Leg Right 09/26/18  1950  -- 1356            Intake/Output Last 24  hours  Intake/Output Summary (Last 24 hours) at 06/13/2022 1203 Last data filed at 06/13/2022 [id number] Gross per 24 hour  Intake 1712.04 ml  Output --  Net 1712.04 ml    Labs/Imaging Results for orders placed or performed during the hospital encounter of 06/12/22 (from the past 48 hour(s))  Urinalysis, Routine w reflex microscopic -Urine, Clean Catch     Status: Abnormal   Collection Time: 06/12/22  9:40 PM  Result Value Ref Range   Color, Urine AMBER (A) YELLOW    Comment: BIOCHEMICALS MAY BE AFFECTED BY COLOR   APPearance CLOUDY (A) CLEAR   Specific Gravity, Urine 1.023 1.005 - 1.030   pH 6.0 5.0 - 8.0   Glucose, UA 50 (A) NEGATIVE mg/dL   Hgb urine dipstick NEGATIVE NEGATIVE   Bilirubin Urine MODERATE (A) NEGATIVE   Ketones, ur 20 (A) NEGATIVE mg/dL   Protein, ur 100 (A) NEGATIVE mg/dL   Nitrite POSITIVE (A) NEGATIVE   Leukocytes,Ua NEGATIVE NEGATIVE   RBC / HPF 0-5 0 - 5 RBC/hpf   WBC, UA 21-50 0 - 5 WBC/hpf   Bacteria, UA MANY (A) NONE SEEN   Squamous Epithelial / HPF 0-5 0 - 5 /HPF   Mucus PRESENT    Hyaline Casts, UA PRESENT     Comment: Performed at [facility], [address] [place], [place] [address]  CBC with Differential     Status: Abnormal   Collection Time: 06/12/22 10:38 PM  Result Value Ref  Range   WBC 4.5 4.0 - 10.5 K/uL   RBC 3.35 (L) 3.87 - 5.11 MIL/uL   Hemoglobin 13.1 12.0 - 15.0 g/dL   HCT 36.7 36.0 - 46.0 %   MCV 109.6 (H) 80.0 - 100.0 fL   MCH 39.1 (H) 26.0 - 34.0 pg   MCHC 35.7 30.0 - 36.0 g/dL   RDW 16.1 (H) 11.5 - 15.5 %   Platelets 211 150 - 400 K/uL   nRBC 0.0 0.0 - 0.2 %   Neutrophils Relative % 75 %   Neutro Abs 3.4 1.7 - 7.7 K/uL   Lymphocytes Relative 19 %   Lymphs Abs 0.8 0.7 - 4.0 K/uL   Monocytes Relative 5 %   Monocytes Absolute 0.2 0.1 - 1.0 K/uL   Eosinophils Relative 0 %   Eosinophils Absolute 0.0 0.0 - 0.5 K/uL   Basophils Relative 1 %   Basophils Absolute 0.0 0.0 - 0.1 K/uL   Immature Granulocytes 0 %   Abs  Immature Granulocytes 0.01 0.00 - 0.07 K/uL    Comment: Performed at [facility], [address], [place], [place] [address]  Comprehensive metabolic panel     Status: Abnormal   Collection Time: 06/12/22 10:38 PM  Result Value Ref Range   Sodium 132 (L) 135 - 145 mmol/L   Potassium 4.6 3.5 - 5.1 mmol/L    Comment: HEMOLYSIS AT THIS LEVEL MAY AFFECT RESULT   Chloride 90 (L) 98 - 111 mmol/L   CO2 22 22 - 32 mmol/L   Glucose, Bld 102 (H) 70 - 99 mg/dL    Comment: Glucose reference range applies only to samples taken after fasting for at least 8 hours.   BUN 17 8 - 23 mg/dL   Creatinine, Ser 2.15 (H) 0.44 - 1.00 mg/dL   Calcium 9.3 8.9 - 10.3 mg/dL   Total Protein 8.0 6.5 - 8.1 g/dL   Albumin 4.3 3.5 - 5.0 g/dL   AST 573 (H) 15 - 41 U/L    Comment: HEMOLYSIS AT THIS LEVEL MAY AFFECT RESULT   ALT 174 (H) 0 - 44 U/L    Comment: HEMOLYSIS AT THIS LEVEL MAY AFFECT RESULT   Alkaline Phosphatase 182 (H) 38 - 126 U/L   Total Bilirubin 1.8 (H) 0.3 - 1.2 mg/dL    Comment: HEMOLYSIS AT THIS LEVEL MAY AFFECT RESULT   GFR, Estimated 25 (L) >60 mL/min    Comment: (NOTE) Calculated using the CKD-EPI Creatinine Equation (2021)    Anion gap 20 (H) 5 - 15    Comment: Performed at [facility], [place] [address] [place], [place] [address]  Ethanol     Status: None   Collection Time: 06/12/22 10:49 PM  Result Value Ref Range   Alcohol, Ethyl (B) <10 <10 mg/dL    Comment: (NOTE) Lowest detectable limit for serum alcohol is 10 mg/dL.  For medical purposes only. Performed at [facility], [place] [address], [place], [place] [address]   Protime-INR     Status: None   Collection Time: 06/12/22 10:49 PM  Result Value Ref Range   Prothrombin Time 13.2 11.4 - 15.2 seconds   INR 1.0 0.8 - 1.2    Comment: (NOTE) INR goal varies based on device and disease states. Performed at [facility], [place] [address] [place], [place] [address]   APTT     Status: None   Collection Time:  06/12/22 10:49 PM  Result Value Ref Range  aPTT 27 24 - 36 seconds    Comment: Performed at [facility], [place] [address] [place], [place] [address]  I-stat chem 8, ED     Status: Abnormal   Collection Time: 06/12/22 11:59 PM  Result Value Ref Range   Sodium 132 (L) 135 - 145 mmol/L   Potassium 4.4 3.5 - 5.1 mmol/L   Chloride 93 (L) 98 - 111 mmol/L   BUN 23 8 - 23 mg/dL   Creatinine, Ser 2.10 (H) 0.44 - 1.00 mg/dL   Glucose, Bld 86 70 - 99 mg/dL    Comment: Glucose reference range applies only to samples taken after fasting for at least 8 hours.   Calcium, Ion 1.01 (L) 1.15 - 1.40 mmol/L   TCO2 28 22 - 32 mmol/L   Hemoglobin 13.3 12.0 - 15.0 g/dL   HCT 39.0 36.0 - 46.0 %   US Abdomen Limited  Result Date: 06/13/2022 CLINICAL DATA:  Right upper quadrant pain. EXAM: ULTRASOUND ABDOMEN LIMITED RIGHT UPPER QUADRANT COMPARISON:  06/13/2022. FINDINGS: Gallbladder: No gallstones or wall thickening visualized. Sludge is present in the gallbladder. No sonographic Murphy sign noted by sonographer. Common bile duct: Diameter: 3.2 mm Liver: No focal lesion identified. Increased parenchymal echogenicity. Portal vein is patent on color Doppler imaging with normal direction of blood flow towards the liver. Other: No free fluid. IMPRESSION: 1. Gallbladder sludge with no acute cholecystitis. 2. Hepatic steatosis Electronically Signed   By: Brett Fairy M.D.   On: 06/13/2022 02:30   CT Renal Stone Study  Result Date: 06/13/2022 CLINICAL DATA:  Generalized weakness, poor appetite, and diarrhea. Abdominal/flank pain, stone suspected. EXAM: CT ABDOMEN AND PELVIS WITHOUT CONTRAST TECHNIQUE: Multidetector CT imaging of the abdomen and pelvis was performed following the standard protocol without IV contrast. RADIATION DOSE REDUCTION: This exam was performed according to the departmental dose-optimization program which includes automated exposure control, adjustment of the mA and/or kV according to patient  size and/or use of iterative reconstruction technique. COMPARISON:  None Available. FINDINGS: Lower chest: No acute abnormality. Hepatobiliary: No focal liver abnormality is seen. Fatty infiltration of the liver is noted. No gallstones, gallbladder wall thickening, or biliary dilatation. Pancreas: Unremarkable. No pancreatic ductal dilatation or surrounding inflammatory changes. Spleen: Normal in size without focal abnormality. Adrenals/Urinary Tract: The adrenal glands are within normal limits. No renal calculus or hydronephrosis. There is mild bladder wall thickening. Stomach/Bowel: Stomach is within normal limits. Appendix appears normal. No evidence of bowel wall thickening, distention, or inflammatory changes. No free air or pneumatosis. Scattered diverticula are present along the colon without evidence of diverticulitis. Vascular/Lymphatic: Aortic atherosclerosis. No enlarged abdominal or pelvic lymph nodes. Reproductive: The uterus has a lobular contour suggesting underlying fibroids. No adnexal mass. Other: No abdominopelvic ascites. Musculoskeletal: No acute osseous abnormality. IMPRESSION: 1. No acute intra-abdominal process. 2. Hepatic steatosis. 3. Diverticulosis without diverticulitis. 4. Uterine fibroids. 5. Aortic atherosclerosis. Electronically Signed   By: Brett Fairy M.D.   On: 06/13/2022 00:28   CT HEAD WO CONTRAST (5MM)  Result Date: 06/12/2022 CLINICAL DATA:  Syncope EXAM: CT HEAD WITHOUT CONTRAST TECHNIQUE: Contiguous axial images were obtained from the base of the skull through the vertex without intravenous contrast. RADIATION DOSE REDUCTION: This exam was performed according to the departmental dose-optimization program which includes automated exposure control, adjustment of the mA and/or kV according to patient size and/or use of iterative reconstruction technique. COMPARISON:  None Available. FINDINGS: Brain: No acute territorial infarction, hemorrhage or intracranial mass. Moderate  atrophy.  Minimal white matter hypodensity likely chronic small vessel ischemic change. Nonenlarged ventricles Vascular: No hyperdense vessels.  No unexpected calcification Skull: Normal. Negative for fracture or focal lesion. Sinuses/Orbits: No acute finding. Other: None IMPRESSION: 1. No CT evidence for acute intracranial abnormality. 2. Atrophy and minimal chronic small vessel ischemic change of the white matter. Electronically Signed   By: Donavan Foil M.D.   On: 06/12/2022 23:50    Pending Labs Unresulted Labs (From admission, onward)     Start     Ordered   06/14/22 0500  Comprehensive metabolic panel  Tomorrow morning,   R        06/13/22 0937   06/14/22 0500  CBC  Tomorrow morning,   R        06/13/22 0937   06/13/22 0934  HIV Antibody (routine testing w rflx)  (HIV Antibody (Routine testing w reflex) panel)  Once,   R        06/13/22 0937   06/12/22 2249  Urine rapid drug screen (hosp performed)  Once,   STAT        06/12/22 2249            Vitals/Pain Today's Vitals   06/13/22 0614 06/13/22 0614 06/13/22 0820 06/13/22 1136  BP:  122/79    Pulse:  89    Resp:      Temp: 98.7 F (37.1 C)  98.4 F (36.9 C) 97.9 F (36.6 C)  TempSrc: Oral  Oral Oral  SpO2:      Weight:      Height:      PainSc:        Isolation Precautions No active isolations  Medications Medications  enoxaparin (LOVENOX) injection 30 mg (30 mg Subcutaneous Given 06/13/22 0957)  oxyCODONE (Oxy IR/ROXICODONE) immediate release tablet 5 mg (has no administration in time range)  morphine (PF) 2 MG/ML injection 2 mg (has no administration in time range)  docusate sodium (COLACE) capsule 100 mg (100 mg Oral Not Given 06/13/22 0957)  polyethylene glycol (MIRALAX / GLYCOLAX) packet 17 g (has no administration in time range)  bisacodyl (DULCOLAX) EC tablet 5 mg (has no administration in time range)  ondansetron (ZOFRAN) tablet 4 mg (has no administration in time range)    Or  ondansetron (ZOFRAN)  injection 4 mg (has no administration in time range)  hydrALAZINE (APRESOLINE) injection 5 mg (has no administration in time range)  0.9 %  sodium chloride infusion ( Intravenous New Bag/Given 06/13/22 0956)  LORazepam (ATIVAN) tablet 1-4 mg (has no administration in time range)    Or  LORazepam (ATIVAN) injection 1-4 mg (has no administration in time range)  thiamine (VITAMIN B1) tablet 100 mg (100 mg Oral Given 06/13/22 0955)    Or  thiamine (VITAMIN B1) injection 100 mg ( Intravenous See Alternative [id number])  folic acid (FOLVITE) tablet 1 mg (1 mg Oral Given 06/13/22 0955)  multivitamin with minerals tablet 1 tablet (1 tablet Oral Given 06/13/22 0955)  cefTRIAXone (ROCEPHIN) 1 g in sodium chloride 0.9 % 100 mL IVPB (has no administration in time range)  thiamine (VITAMIN B1) injection 100 mg (100 mg Intravenous Given 06/13/22 0005)  sodium chloride 0.9 % bolus 500 mL (0 mLs Intravenous Stopped 06/13/22 0050)  cefTRIAXone (ROCEPHIN) 1 g in sodium chloride 0.9 % 100 mL IVPB (0 g Intravenous Stopped 06/13/22 0036)    Mobility walks with person assist     Focused Assessments gastrointestinal   R Recommendations: See Admitting Provider Note  Report given to:   Additional Notes:

## 2022-06-13 NOTE — ED Notes (Signed)
Pt appears to be comfortable and resting, can observe even RR that are unlabored, pt remains on cardiac monitoring devices, no changes noted, side rails up x2 for safety, NAD noted, plan of care ongoing, call light within reach, no further concerns as of present.

## 2022-06-13 NOTE — ED Notes (Signed)
Pt return from CT.

## 2022-06-14 DIAGNOSIS — R7989 Other specified abnormal findings of blood chemistry: Secondary | ICD-10-CM | POA: Diagnosis not present

## 2022-06-14 DIAGNOSIS — K700 Alcoholic fatty liver: Secondary | ICD-10-CM

## 2022-06-14 DIAGNOSIS — R531 Weakness: Secondary | ICD-10-CM | POA: Diagnosis not present

## 2022-06-14 LAB — COMPREHENSIVE METABOLIC PANEL
ALT: 83 U/L — ABNORMAL HIGH (ref 0–44)
AST: 179 U/L — ABNORMAL HIGH (ref 15–41)
Albumin: 2.9 g/dL — ABNORMAL LOW (ref 3.5–5.0)
Alkaline Phosphatase: 124 U/L (ref 38–126)
Anion gap: 9 (ref 5–15)
BUN: 15 mg/dL (ref 8–23)
CO2: 25 mmol/L (ref 22–32)
Calcium: 8.1 mg/dL — ABNORMAL LOW (ref 8.9–10.3)
Chloride: 102 mmol/L (ref 98–111)
Creatinine, Ser: 0.76 mg/dL (ref 0.44–1.00)
GFR, Estimated: >60 mL/min (ref >60)
Glucose, Bld: 94 mg/dL (ref 70–99)
Potassium: 2.9 mmol/L — ABNORMAL LOW (ref 3.5–5.1)
Sodium: 136 mmol/L (ref 135–145)
Total Bilirubin: 0.7 mg/dL (ref 0.3–1.2)
Total Protein: 5.5 g/dL — ABNORMAL LOW (ref 6.5–8.1)

## 2022-06-14 LAB — CBC
HCT: 27.3 % — ABNORMAL LOW (ref 36.0–46.0)
Hemoglobin: 10 g/dL — ABNORMAL LOW (ref 12.0–15.0)
MCH: 38.6 pg — ABNORMAL HIGH (ref 26.0–34.0)
MCHC: 36.6 g/dL — ABNORMAL HIGH (ref 30.0–36.0)
MCV: 105.4 fL — ABNORMAL HIGH (ref 80.0–100.0)
Platelets: 114 10*3/uL — ABNORMAL LOW (ref 150–400)
RBC: 2.59 MIL/uL — ABNORMAL LOW (ref 3.87–5.11)
RDW: 15.6 % — ABNORMAL HIGH (ref 11.5–15.5)
WBC: 3 10*3/uL — ABNORMAL LOW (ref 4.0–10.5)
nRBC: 0 % (ref 0.0–0.2)

## 2022-06-14 LAB — MAGNESIUM: Magnesium: 1.3 mg/dL — ABNORMAL LOW (ref 1.7–2.4)

## 2022-06-14 LAB — VITAMIN B12: Vitamin B-12: 544 pg/mL (ref 180–914)

## 2022-06-14 LAB — EPSTEIN-BARR VIRUS VCA, IGM: EBV VCA IgM: <36 U/mL (ref 0.0–35.9)

## 2022-06-14 LAB — CMV IGM: CMV IgM: <30 AU/mL (ref 0.0–29.9)

## 2022-06-14 LAB — TSH: TSH: 1.118 u[IU]/mL (ref 0.350–4.500)

## 2022-06-14 LAB — EPSTEIN-BARR VIRUS VCA, IGG: EBV VCA IgG: 70.1 U/mL — ABNORMAL HIGH (ref 0.0–17.9)

## 2022-06-14 MED ORDER — ENOXAPARIN SODIUM 40 MG/0.4ML IJ SOSY
40.0000 mg | PREFILLED_SYRINGE | INTRAMUSCULAR | Status: DC
  Administered 2022-06-15: 40 mg via SUBCUTANEOUS
  Filled 2022-06-14: qty 0.4

## 2022-06-14 MED ORDER — POTASSIUM CHLORIDE CRYS ER 20 MEQ PO TBCR
40.0000 meq | EXTENDED_RELEASE_TABLET | ORAL | Status: AC
  Administered 2022-06-14 (×2): 40 meq via ORAL
  Filled 2022-06-14 (×2): qty 2

## 2022-06-14 MED ORDER — BOOST / RESOURCE BREEZE PO LIQD CUSTOM
1.0000 | Freq: Three times a day (TID) | ORAL | Status: DC
  Administered 2022-06-14 – 2022-06-15 (×2): 1 via ORAL

## 2022-06-14 MED ORDER — MAGNESIUM SULFATE 4 GM/100ML IV SOLN
4.0000 g | Freq: Once | INTRAVENOUS | Status: AC
  Administered 2022-06-14: 4 g via INTRAVENOUS
  Filled 2022-06-14: qty 100

## 2022-06-14 NOTE — Evaluation (Signed)
Occupational Therapy Evaluation Patient Details Name: Stacey Oconnor MRN: [id number] DOB: 10-Jul-1955 Today's Date: 06/14/2022   History of Present Illness 67 y.o. female without significant medical history presenting with weakness, multifactorial in the setting of alcohol use, electrolyte imbalance UTI acute renal failure.   Clinical Impression   Patient admitted for the diagnosis above.  Subjectively, she feels better, still admits to unsteadiness, but no overt loss of balance.  Able to walk in the room, and in the hall with generalized supervision.  No assist for ADL completion.  No significant OT needs identified in the acute setting.  No post acute OT anticipated.  PT consult pending.  Recommended patient get assist for community mobility until she feels back to her baseline.  She has a family member that lives next door, that can assist.       Recommendations for follow up therapy are one component of a multi-disciplinary discharge planning process, led by the attending physician.  Recommendations may be updated based on patient status, additional functional criteria and insurance authorization.   Follow Up Recommendations  No OT follow up     Assistance Recommended at Discharge PRN  Patient can return home with the following Assist for transportation    Functional Status Assessment  Patient has had a recent decline in their functional status and demonstrates the ability to make significant improvements in function in a reasonable and predictable amount of time.  Equipment Recommendations  None recommended by OT    Recommendations for Other Services       Precautions / Restrictions Precautions Precautions: Fall Restrictions Weight Bearing Restrictions: No      Mobility Bed Mobility Overal bed mobility: Independent                  Transfers Overall transfer level: Needs assistance Equipment used: None Transfers: Sit to/from Stand, Bed to  chair/wheelchair/BSC Sit to Stand: Modified independent (Device/Increase time)     Step pivot transfers: Supervision            Balance Overall balance assessment: Needs assistance Sitting-balance support: Feet supported Sitting balance-Leahy Scale: Normal     Standing balance support: No upper extremity supported Standing balance-Leahy Scale: Fair Standing balance comment: mild unsteadinss but no overt LOB                           ADL either performed or assessed with clinical judgement   ADL Overall ADL's : At baseline                                       General ADL Comments: generalized supervision for mild unsteadiness     Vision Baseline Vision/History: 1 Wears glasses Patient Visual Report: No change from baseline       Perception     Praxis      Pertinent Vitals/Pain Pain Assessment Pain Assessment: No/denies pain     Hand Dominance Right   Extremity/Trunk Assessment Upper Extremity Assessment Upper Extremity Assessment: Overall WFL for tasks assessed   Lower Extremity Assessment Lower Extremity Assessment: Defer to PT evaluation   Cervical / Trunk Assessment Cervical / Trunk Assessment: Normal   Communication Communication Communication: No difficulties   Cognition Arousal/Alertness: Awake/alert Behavior During Therapy: WFL for tasks assessed/performed Overall Cognitive Status: Within Functional Limits for tasks assessed  Home Living Family/patient expects to be discharged to:: Private residence Living Arrangements: Alone Available Help at Discharge: Family;Available PRN/intermittently Type of Home: House Home Access: Stairs to enter [employer] of Steps: 3 Entrance Stairs-Rails: Left Home Layout: One level     Bathroom Shower/Tub: Teacher, early years/pre: Standard Bathroom Accessibility: Yes How  Accessible: Accessible via walker Home Equipment: None          Prior Functioning/Environment Prior Level of Function : Independent/Modified Independent;Driving                        OT Problem List: Impaired balance (sitting and/or standing)      OT Treatment/Interventions:      OT Goals(Current goals can be found in the care plan section) Acute Rehab OT Goals Patient Stated Goal: Hoping to return hoem tomorrow OT Goal Formulation: With patient Time For Goal Achievement: 06/21/22 Potential to Achieve Goals: Good  OT Frequency:      Co-evaluation              AM-PAC OT "6 Clicks" Daily Activity     Outcome Measure Help from another person eating meals?: None Help from another person taking care of personal grooming?: None Help from another person toileting, which includes using toliet, bedpan, or urinal?: A Little Help from another person bathing (including washing, rinsing, drying)?: None Help from another person to put on and taking off regular upper body clothing?: None Help from another person to put on and taking off regular lower body clothing?: None 6 Click Score: 23   End of Session Equipment Utilized During Treatment: Gait belt Nurse Communication: Mobility status  Activity Tolerance: Patient tolerated treatment well Patient left: in chair;with call bell/phone within reach  OT Visit Diagnosis: Unsteadiness on feet (R26.81)                Time: [id number] OT Time Calculation (min): 20 min Charges:  OT General Charges $OT Visit: 1 Visit OT Evaluation $OT Eval Moderate Complexity: 1 Mod  06/14/2022  RP, OTR/L  Acute Rehabilitation Services  Office:  [phone]   Metta Clines 06/14/2022, 12:30 PM

## 2022-06-14 NOTE — Progress Notes (Signed)
PT Cancellation Note  Patient Details Name: Stacey Oconnor MRN: [id number] DOB: 1956/04/20   Cancelled Treatment:    Reason Eval/Treat Not Completed: Other (comment).  Pt feels comfortable that she can manage her stairs at home, declining PT politely.  Will recheck tomorrow to be sure she is confident, then DC PT.   Ramond Dial 06/14/2022, 1:05 PM  Mee Hives, PT PhD Acute Rehab Dept. Number: [place] [facility] [place]

## 2022-06-14 NOTE — Progress Notes (Signed)
PROGRESS NOTE Stacey Oconnor  [id number] DOB: [birth year] DOA: 06/12/2022 PCP: Nolene Ebbs, MD   Brief Narrative/Hospital Course: 44 yof w/ significant medical history presented with weakness.  as per Dr Lorin Mercy admitting MD:she felt weak and lightheaded starting about 6pm 2/14  She ran errands through the day  2/14without difficulty.  She feels shaky all over.  No n/w/t or headache. No visual disturbance.  No dysphagia or dysarthria.  She had this type of issue once before  - that time she had a syncopal episode at [employer] [facility] [employer] after having been in the hospital before that and so she did not come in.  No urinary symptoms.  No abdominal pain or n/v.  She had 1 episode of diarrhea  2/14 Decreased appetite recently, hasn't been eating well.   In the ED CT head negative neurology consult code stroke UA consistent with UTI, abnormal LFTs, does have alcohol use history, was tremulous on exam but no history of withdrawals previously Placed on IV antibiotics and admitted for further management    Subjective: Seen this am Feels much better overall  Stronger. No new complaints  Assessment and Plan: Principal Problem:   Generalized weakness Active Problems:   UTI (urinary tract infection)   AKI (acute kidney injury) (HCC)   Elevated LFTs    Generalized weakness: Multifactorial in the setting of alcohol use, electrolyte imbalance UTI acute renal failure: Patient symptomatically feels much improved continue hydration and antibiotics get PT OT evaluation.  Check TSH B12  Hypokalemia Hypomagnesemia: Being replaced aggressively this morning this is due to her poor oral intake/alcohol abuse.  Repeat labs in the morning  UTI: Follow-up urine culture continue Rocephin.   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 Gi seeing   Alcohol use history patient admitted on CIWA scale-scale stable   DVT prophylaxis: enoxaparin (LOVENOX) injection 30 mg Start: 06/13/22 0945 Code Status:   Code Status: Full Code Family Communication: plan of care discussed with patient at bedside. Patient status is: inpatient  because of aki uti Level of care: Med-Surg   Dispo: The patient is from: Home lives alone            Anticipated disposition: Home 24 hrs.  PT OT evaluation requested today Objective: Vitals last 24 hrs: Vitals:   06/13/22 1200 06/13/22 2006 06/14/22 0547 06/14/22 0700  BP: 114/79 109/84 (!) 131/93 124/88  Pulse: 89 94 83 85  Resp: 18 17  17  $ Temp:  98.8 F (37.1 C) 98.9 F (37.2 C) 98.8 F (37.1 C)  TempSrc:  Oral Oral   SpO2: 100% 100% 100% 100%  Weight:      Height:       Weight change:   Physical Examination: General exam: alert awake oriented x 3, older than stated age HEENT:Oral mucosa moist, Ear/Nose WNL grossly Respiratory system: bilaterally clear BS,no use of accessory muscle Cardiovascular system: S1 & S2 +, No JVD. Gastrointestinal system: Abdomen soft,NT,ND, BS+ Nervous System:Alert, awake, moving extremities. Extremities: LE edema neg,distal peripheral pulses palpable.  Skin: No rashes,no icterus. MSK: Normal muscle bulk,tone, power  Medications reviewed:  Scheduled Meds:  docusate sodium  100 mg Oral BID   enoxaparin (LOVENOX) injection  30 mg Subcutaneous [id number]   folic acid  1 mg Oral Daily   multivitamin with minerals  1 tablet Oral Daily   thiamine  100 mg Oral Daily   Or   thiamine  100 mg Intravenous Daily   Continuous Infusions:  sodium chloride 100 mL/hr at 06/14/22 1004   cefTRIAXone (ROCEPHIN)  IV Stopped (06/13/22 2211)   magnesium sulfate bolus IVPB 4 g (06/14/22 1015)    Diet Order             Diet regular Room  service appropriate? Yes; Fluid consistency: Thin  Diet effective now                   Intake/Output Summary (Last 24 hours) at 06/14/2022 1031 Last data filed at 06/14/2022 1004 Gross per 24 hour  Intake 2087.15 ml  Output --  Net 2087.15 ml   Net IO Since Admission: 4,799.19 mL [06/14/22 1031]  Wt Readings from Last 3 Encounters:  06/12/22 76.7 kg  07/20/21 76.7 kg  07/13/21 76.7 kg     Unresulted Labs (From admission, onward)     Start     Ordered   06/15/22 0500  Comprehensive metabolic panel  Daily,   R      06/14/22 0841   06/15/22 0500  CBC  Daily,   R      06/14/22 0841   06/15/22 0500  Magnesium  Tomorrow morning,   R        06/14/22 0841   06/13/22 1640  Epstein-Barr virus VCA, IgG  (Viral Screen (EBV CAPSID AB+CMV AB) (PNL))  Once,   R        06/13/22 1639   06/13/22 1640  Epstein-Barr virus VCA, IgM  (Viral Screen (EBV CAPSID AB+CMV AB) (PNL))  Once,   R        06/13/22 1639   06/12/22 2249  Urine rapid drug screen (hosp performed)  Once,   STAT        06/12/22 2249          Data Reviewed: I have personally reviewed following labs and imaging studies CBC: Recent Labs  Lab 06/12/22 2238 06/12/22 2359 06/14/22 0412  WBC 4.5  --  3.0*  NEUTROABS 3.4  --   --   HGB 13.1 13.3 10.0*  HCT 36.7 39.0 27.3*  MCV 109.6*  --  105.4*  PLT 211  --  99991111*   Basic Metabolic Panel: Recent Labs  Lab 06/12/22 2238 06/12/22 2359 06/14/22 0412  NA 132* 132* 136  K 4.6 4.4 2.9*  CL 90* 93* 102  CO2 22  --  25  GLUCOSE 102* 86 94  BUN 17 23 15  $ CREATININE 2.15* 2.10* 0.76  CALCIUM 9.3  --  8.1*  MG  --   --  1.3*   GFR: Estimated Creatinine Clearance: 73.4 mL/min (by C-G formula based on SCr of 0.76 mg/dL). Liver Function Tests: Recent Labs  Lab 06/12/22 2238 06/14/22 0412  AST 573* 179*  ALT 174* 83*  ALKPHOS 182* 124  BILITOT 1.8* 0.7  PROT 8.0 5.5*  ALBUMIN 4.3 2.9*   Recent Labs  Lab 06/12/22 2249  INR 1.0   No results found for this  or any previous visit (from the past 240 hour(s)).  Antimicrobials: Anti-infectives (From admission, onward)    Start     Dose/Rate Route Frequency Ordered Stop   06/13/22 2200  cefTRIAXone (ROCEPHIN) 1 g in sodium chloride 0.9 % 100 mL IVPB        1 g  200 mL/hr over 30 Minutes Intravenous Every 24 hours 06/13/22 0937     06/12/22 2345  cefTRIAXone (ROCEPHIN) 1 g in sodium chloride 0.9 % 100 mL IVPB        1 g 200 mL/hr over 30 Minutes Intravenous  Once 06/12/22 2344 06/13/22 0036      Culture/Microbiology No results found for: "SDES", "SPECREQUEST", "CULT", "REPTSTATUS"   Radiology Studies: US Abdomen Limited  Result Date: 06/13/2022 CLINICAL DATA:  Right upper quadrant pain. EXAM: ULTRASOUND ABDOMEN LIMITED RIGHT UPPER QUADRANT COMPARISON:  06/13/2022. FINDINGS: Gallbladder: No gallstones or wall thickening visualized. Sludge is present in the gallbladder. No sonographic Murphy sign noted by sonographer. Common bile duct: Diameter: 3.2 mm Liver: No focal lesion identified. Increased parenchymal echogenicity. Portal vein is patent on color Doppler imaging with normal direction of blood flow towards the liver. Other: No free fluid. IMPRESSION: 1. Gallbladder sludge with no acute cholecystitis. 2. Hepatic steatosis Electronically Signed   By: Brett Fairy M.D.   On: 06/13/2022 02:30   CT Renal Stone Study  Result Date: 06/13/2022 CLINICAL DATA:  Generalized weakness, poor appetite, and diarrhea. Abdominal/flank pain, stone suspected. EXAM: CT ABDOMEN AND PELVIS WITHOUT CONTRAST TECHNIQUE: Multidetector CT imaging of the abdomen and pelvis was performed following the standard protocol without IV contrast. RADIATION DOSE REDUCTION: This exam was performed according to the departmental dose-optimization program which includes automated exposure control, adjustment of the mA and/or kV according to patient size and/or use of iterative reconstruction technique. COMPARISON:  None Available.  FINDINGS: Lower chest: No acute abnormality. Hepatobiliary: No focal liver abnormality is seen. Fatty infiltration of the liver is noted. No gallstones, gallbladder wall thickening, or biliary dilatation. Pancreas: Unremarkable. No pancreatic ductal dilatation or surrounding inflammatory changes. Spleen: Normal in size without focal abnormality. Adrenals/Urinary Tract: The adrenal glands are within normal limits. No renal calculus or hydronephrosis. There is mild bladder wall thickening. Stomach/Bowel: Stomach is within normal limits. Appendix appears normal. No evidence of bowel wall thickening, distention, or inflammatory changes. No free air or pneumatosis. Scattered diverticula are present along the colon without evidence of diverticulitis. Vascular/Lymphatic: Aortic atherosclerosis. No enlarged abdominal or pelvic lymph nodes. Reproductive: The uterus has a lobular contour suggesting underlying fibroids. No adnexal mass. Other: No abdominopelvic ascites. Musculoskeletal: No acute osseous abnormality. IMPRESSION: 1. No acute intra-abdominal process. 2. Hepatic steatosis. 3. Diverticulosis without diverticulitis. 4. Uterine fibroids. 5. Aortic atherosclerosis. Electronically Signed   By: Brett Fairy M.D.   On: 06/13/2022 00:28   CT HEAD WO CONTRAST (5MM)  Result Date: 06/12/2022 CLINICAL DATA:  Syncope EXAM: CT HEAD WITHOUT CONTRAST TECHNIQUE: Contiguous axial images were obtained from the base of the skull through the vertex without intravenous contrast. RADIATION DOSE REDUCTION: This exam was performed according to the departmental dose-optimization program which includes automated exposure control, adjustment of the mA and/or kV according to patient size and/or use of iterative reconstruction technique. COMPARISON:  None Available. FINDINGS: Brain: No acute territorial infarction, hemorrhage or intracranial mass. Moderate atrophy. Minimal white matter hypodensity likely chronic small vessel ischemic  change. Nonenlarged ventricles Vascular: No hyperdense vessels.  No unexpected calcification Skull: Normal. Negative for fracture or focal lesion. Sinuses/Orbits: No acute finding. Other: None IMPRESSION: 1. No CT evidence for acute intracranial abnormality. 2. Atrophy and minimal chronic small vessel ischemic change of the white matter. Electronically Signed   By: Donavan Foil M.D.   On: 06/12/2022 23:50     LOS: 1 day   Antonieta Pert, MD [facility]  06/14/2022, 10:31 AM

## 2022-06-14 NOTE — Discharge Instructions (Signed)

## 2022-06-14 NOTE — Hospital Course (Addendum)
11 yof w/ significant medical history presented with weakness.  as per Dr Lorin Mercy admitting MD:she felt weak and lightheaded starting about 6pm 2/14  She ran errands through the day  2/14without difficulty.  She feels shaky all over.  No n/w/t or headache. No visual disturbance.  No dysphagia or dysarthria.  She had this type of issue once before  - that time she had a syncopal episode at [employer] after having been in the hospital before that and so she did not come in.  No urinary symptoms.  No abdominal pain or n/v.  She had 1 episode of diarrhea  2/14 Decreased appetite recently, hasn't been eating well.   In the ED CT head negative neurology consult code stroke UA consistent with UTI, abnormal LFTs, does have alcohol use history, was tremulous on exam but no history of withdrawals previously Placed on IV antibiotics and admitted for further management

## 2022-06-14 NOTE — Progress Notes (Signed)
Initial Nutrition Assessment  DOCUMENTATION CODES:   Not applicable  INTERVENTION:  Changed to room service for pt to choose food items that she may prefer Boost Breeze po TID, each supplement provides 250 kcal and 9 grams of protein MVI with minerals daily Request updated measured weight "High Calorie, High Protein Nutrition Therapy" handout added to AVS  NUTRITION DIAGNOSIS:   Inadequate oral intake related to decreased appetite as evidenced by per patient/family report.  GOAL:   Patient will meet greater than or equal to 90% of their needs  MONITOR:   PO intake, Supplement acceptance, Weight trends, Labs  REASON FOR ASSESSMENT:   Consult Other (Comment) (nutrition goals)  ASSESSMENT:   Pt admitted from home with generalized weakness. No significant PMH on file.  Pt states that at home recently she has not been eating great. She has been eating smaller portions d/t decreased appetite. If she doesn't feel up to cooking, she may pick up something to eat. Although when she does, she usually only eats about half of the meal and will leave the other half.   She has tried ensure and boost but does not enjoy these. Despite poor PO intake, she endorses frequent beverage intake including apple juice, orange juice, water and gatorade, therefore is agreeable to trying Boost breeze during admission.   Observed lunch tray on rolling table. She ate all of the sides on plate but did not eat the side salad or the fish as she does not enjoy baked fish. Pt would prefer to not receive automatic trays so she can order foods she enjoys and will eat without wasting.   Pt states that her weight has declined since December from 132 lbs down to 129 lbs. Unfortunately cannot confirm this weight loss as the only documented weight on file is noted to be 76.7 kg since 2020.  Will request updated measured weight to assess current weight status.   Medications: colace, folvite, MVI, thiamine IV drips:  NaCl @100ml$ /hr, abx   Labs: potassium 2.9, Mg 1.3, AST 179, ALT 83   NUTRITION - FOCUSED PHYSICAL EXAM:  Flowsheet Row Most Recent Value  Orbital Region No depletion  Upper Arm Region Mild depletion  Thoracic and Lumbar Region No depletion  Buccal Region No depletion  Temple Region No depletion  Clavicle Bone Region No depletion  Clavicle and Acromion Bone Region No depletion  Scapular Bone Region No depletion  Dorsal Hand Mild depletion  Patellar Region Mild depletion  Anterior Thigh Region Mild depletion  Posterior Calf Region Moderate depletion  Edema (RD Assessment) None  Hair Reviewed  Eyes Reviewed  Mouth Reviewed  Skin Reviewed  Nails Reviewed       Diet Order:   Diet Order             Diet regular Room service appropriate? Yes; Fluid consistency: Thin  Diet effective now                   EDUCATION NEEDS:   Education needs have been addressed  Skin:  Skin Assessment: Reviewed RN Assessment  Last BM:  2/15  Height:   Ht Readings from Last 1 Encounters:  06/12/22 5' 6"$  (1.676 m)    Weight:   Wt Readings from Last 1 Encounters:  06/12/22 76.7 kg   BMI:  Body mass index is 27.28 kg/m.  Estimated Nutritional Needs:   Kcal:  1500-1700  Protein:  75-90g  Fluid:  1.5-1.7L  Clayborne Dana, RDN, LDN Clinical Nutrition

## 2022-06-14 NOTE — Consult Note (Addendum)
Referring Provider: Dr. Karmen Bongo Primary Care Physician:  Nolene Ebbs, MD Primary Gastroenterologist: Althia Forts  Reason for Consultation: Elevated LFTs  HPI: Stacey Oconnor is a 67 y.o. female with no significant past medical history.  She developed significant weakness and felt lightheaded today at bingo followed by passing a nonbloody brown diarrhea bowel movement.  She continued to feel lightheaded and weak therefore she presented to the ED via EMS 06/12/2022 for further evaluation. Initially code stroke was called then canceled.  Head CT without acute intracranial abnormality.  Labs in the ED showed a sodium level 132.  BUN 17.  Creatinine 2.15 up from 0.64.  Total bili 1.8.  Alk phos 182.  AST 573.  ALT 174.  WBC 4.5.  Hemoglobin 13.1.  MCV 109.6.  Platelet 211.  Ethanol < 10.  INR 1.0.  HIV nonreactive.  Acute hepatitis panel negative.  EBV IgM pending.  CMV GM < 30.  RUQ sonogram 06/13/2022 showed hepatic steatosis and gallbladder sludge without evidence of acute cholecystitis.  Portal vein was patent.  CTAP without contrast identified hepatic steatosis, diverticulosis without evidence of diverticulitis, uterine fibroids and aortic atherosclerosis without evidence of kidney stones or hydronephrosis but mild bladder wall thickening was noted.  On Rocephin for possible UTI.  She is having any recent fevers.  No nausea or vomiting.  No upper or lower abdominal pain.  She was incontinent of diarrhea 06/12/2022 as noted above but typically passes a normal formed brown stool daily.  No rectal bleeding or black stool.  She passed formed BM this morning.  Chest pain or shortness of breath.  No history of liver disease.  No family history of liver disease.  Infrequent Tylenol use.  She drinks wine, 1 bottle will last 1 week.  No drug use.  No recent antibiotic use.  Her newest medication was for insomnia started 1 month ago.  No herbal supplements.  She reports feeling quite well today.  She is  ambulating up in the room without feeling lightheaded or weak.  She underwent a Cologuard approximately 1 year ago which she reported was negative.    Past Medical History:  Diagnosis Date   Ankle fracture    Left    Past Surgical History:  Procedure Laterality Date   ABSCESS DRAINAGE     Left Buttock   ORIF ANKLE FRACTURE Left 09/26/2018   Procedure: OPEN REDUCTION INTERNAL FIXATION (ORIF) ANKLE FRACTURE;  Surgeon: Meredith Pel, MD;  Location: [facility];  Service: Orthopedics;  Laterality: Left;    Prior to Admission medications   Medication Sig Start Date End Date Taking? Authorizing Provider  acetaminophen (TYLENOL) 500 MG tablet Take 500 mg by mouth every 6 (six) hours as needed for mild pain.   Yes [provider]  hydrOXYzine (ATARAX) 25 MG tablet Take 25 mg by mouth at bedtime as needed (for sleep). 05/31/22  Yes [provider]  Multiple Vitamin (MULTIVITAMIN WITH MINERALS) TABS tablet Take 1 tablet by mouth daily.   Yes [provider]  tetrahydrozoline 0.05 % ophthalmic solution Place 1 drop into both eyes daily as needed (dry eyes).   Yes [provider]  Vitamin D, Ergocalciferol, (DRISDOL) 1.25 MG (50000 UNIT) CAPS capsule Take 50,000 Units by mouth once a week. 03/29/22  Yes [provider]    Current Facility-Administered Medications  Medication Dose Route Frequency Provider Last Rate Last Admin   0.9 %  sodium chloride infusion   Intravenous Continuous Karmen Bongo, MD 100 mL/hr  at 06/14/22 0646 Infusion Verify at 06/14/22 0646   bisacodyl (DULCOLAX) EC tablet 5 mg  5 mg Oral Daily PRN Karmen Bongo, MD       cefTRIAXone (ROCEPHIN) 1 g in sodium chloride 0.9 % 100 mL IVPB  1 g Intravenous Q24H Karmen Bongo, MD   Stopped at 06/13/22 2211   docusate sodium (COLACE) capsule 100 mg  100 mg Oral BID Karmen Bongo, MD   100 mg at 06/13/22 2133   enoxaparin (LOVENOX) injection 30 mg  30 mg Subcutaneous Q24H Karmen Bongo, MD   30 mg at [id number]   folic acid (FOLVITE) tablet 1 mg  1 mg Oral Daily Karmen Bongo, MD   1 mg at 06/13/22 [id number]   hydrALAZINE (APRESOLINE) injection 5 mg  5 mg Intravenous Q4H PRN Karmen Bongo, MD       LORazepam (ATIVAN) tablet 1-4 mg  1-4 mg Oral Q1H PRN Karmen Bongo, MD       Or   LORazepam (ATIVAN) injection 1-4 mg  1-4 mg Intravenous Q1H PRN Karmen Bongo, MD       morphine (PF) 2 MG/ML injection 2 mg  2 mg Intravenous Q2H PRN Karmen Bongo, MD       multivitamin with minerals tablet 1 tablet  1 tablet Oral Daily Karmen Bongo, MD   1 tablet at 06/13/22 0955   ondansetron (ZOFRAN) tablet 4 mg  4 mg Oral Q6H PRN Karmen Bongo, MD       Or   ondansetron [facility]) injection 4 mg  4 mg Intravenous Q6H PRN Karmen Bongo, MD       oxyCODONE (Oxy IR/ROXICODONE) immediate release tablet 5 mg  5 mg Oral Q4H PRN Karmen Bongo, MD       polyethylene glycol (MIRALAX / GLYCOLAX) packet 17 g  17 g Oral Daily PRN Karmen Bongo, MD       potassium chloride SA (KLOR-CON M) CR tablet 40 mEq  40 mEq Oral Q3H Kc, Ramesh, MD   40 mEq at 06/14/22 0640   thiamine (VITAMIN B1) tablet 100 mg  100 mg Oral Daily Karmen Bongo, MD   100 mg at 06/13/22 [id number]   Or   thiamine (VITAMIN B1) injection 100 mg  100 mg Intravenous Daily Karmen Bongo, MD        Allergies as of 06/12/2022   (No Known Allergies)    History reviewed. No pertinent family history.  Social History   Socioeconomic History   Marital status: Divorced    Spouse name: Not on file   Number of children: Not on file   Years of education: Not on file   Highest education level: Not on file  Occupational History   Occupation: retired  Tobacco Use   Smoking status: Never   Smokeless tobacco: Never  Vaping Use   Vaping Use: Never used  Substance and Sexual Activity   Alcohol use: Yes    Comment: 1 bottle of wine a week   Drug use: Never   Sexual activity: Not on file  Other Topics Concern   Not  on file  Social History Narrative   Not on file   Social Determinants of Health   Financial Resource Strain: Not on file  Food Insecurity: No Food Insecurity (06/13/2022)   Hunger Vital Sign    Worried About Running Out of Food in the Last Year: Never true    Ran Out of Food in the Last Year: Never true  Transportation Needs: No Transportation Needs (  06/13/2022)   PRAPARE - Hydrologist (Medical): No    Lack of Transportation (Non-Medical): No  Physical Activity: Not on file  Stress: Not on file  Social Connections: Not on file  Intimate Partner Violence: Not At Risk (06/13/2022)   Humiliation, Afraid, Rape, and Kick questionnaire    Fear of Current or Ex-Partner: No    Emotionally Abused: No    Physically Abused: No    Sexually Abused: No    Review of Systems: Gen: Denies fever, sweats or chills. No weight loss.  CV: Denies chest pain, palpitations or edema. Resp: Denies cough, shortness of breath of hemoptysis.  GI: Denies heartburn, dysphagia, stomach or lower abdominal pain. No diarrhea or constipation. No rectal bleeding or melena.   GU : Denies urinary burning, blood in urine, increased urinary frequency or incontinence. MS: Denies joint pain, muscles aches or weakness. Derm: Denies rash, itchiness, skin lesions or unhealing ulcers. Psych: Denies depression, anxiety, memory loss or confusion. Heme: Denies easy bruising, bleeding. Neuro: See HPI. Endo:  Denies any problems with DM, thyroid or adrenal function.  Physical Exam: Vital signs in last 24 hours: Temp:  [97.9 F (36.6 C)-98.9 F (37.2 C)] 98.9 F (37.2 C) (02/16 0547) Pulse Rate:  [83-94] 83 (02/16 0547) Resp:  [17-18] 17 (02/15 2006) BP: (109-131)/(79-93) 131/93 (02/16 0547) SpO2:  [100 %] 100 % (02/16 0547) Last BM Date : 06/13/22 General:  Alert 67 year old female in no acute distress. Head:  Normocephalic and atraumatic. Eyes:  No scleral icterus. Conjunctiva pink. Ears:   Normal auditory acuity. Nose:  No deformity, discharge or lesions. Mouth:  Dentition intact. No ulcers or lesions.  Neck:  Supple. No lymphadenopathy or thyromegaly.  Lungs: Breath sounds clear throughout. No wheezes, rhonchi or crackles.  Heart: Rate and rhythm, no murmurs. Abdomen: Soft, nondistended.  Positive bowel sounds to all 4 quadrants. Rectal: Deferred. Musculoskeletal:  Symmetrical without gross deformities.  Pulses:  Normal pulses noted. Extremities:  Without clubbing or edema. Neurologic:  Alert and  oriented x 4. No focal deficits.  Skin:  Intact without significant lesions or rashes. Psych:  Alert and cooperative. Normal mood and affect.  Intake/Output from previous day: 02/15 0701 - 02/16 0700 In: 3875 [P.O.:700; I.V.:2975; IV Piggyback:200] Out: -  Intake/Output this shift: No intake/output data recorded.  Lab Results: Recent Labs    06/12/22 2238 06/12/22 2359 06/14/22 0412  WBC 4.5  --  3.0*  HGB 13.1 13.3 10.0*  HCT 36.7 39.0 27.3*  PLT 211  --  114*   BMET Recent Labs    06/12/22 2238 06/12/22 2359 06/14/22 0412  NA 132* 132* 136  K 4.6 4.4 2.9*  CL 90* 93* 102  CO2 22  --  25  GLUCOSE 102* 86 94  BUN 17 23 15  $ CREATININE 2.15* 2.10* 0.76  CALCIUM 9.3  --  8.1*   LFT Recent Labs    06/14/22 0412  PROT 5.5*  ALBUMIN 2.9*  AST 179*  ALT 83*  ALKPHOS 124  BILITOT 0.7   PT/INR Recent Labs    06/12/22 2249  LABPROT 13.2  INR 1.0   Hepatitis Panel Recent Labs    06/13/22 1731  HEPBSAG NON REACTIVE  HCVAB NON REACTIVE  HEPAIGM NON REACTIVE  HEPBIGM NON REACTIVE      Studies/Results: US Abdomen Limited  Result Date: 06/13/2022 CLINICAL DATA:  Right upper quadrant pain. EXAM: ULTRASOUND ABDOMEN LIMITED RIGHT UPPER QUADRANT COMPARISON:  06/13/2022. FINDINGS: Gallbladder: No  gallstones or wall thickening visualized. Sludge is present in the gallbladder. No sonographic Murphy sign noted by sonographer. Common bile duct:  Diameter: 3.2 mm Liver: No focal lesion identified. Increased parenchymal echogenicity. Portal vein is patent on color Doppler imaging with normal direction of blood flow towards the liver. Other: No free fluid. IMPRESSION: 1. Gallbladder sludge with no acute cholecystitis. 2. Hepatic steatosis Electronically Signed   By: Brett Fairy M.D.   On: 06/13/2022 02:30   CT Renal Stone Study  Result Date: 06/13/2022 CLINICAL DATA:  Generalized weakness, poor appetite, and diarrhea. Abdominal/flank pain, stone suspected. EXAM: CT ABDOMEN AND PELVIS WITHOUT CONTRAST TECHNIQUE: Multidetector CT imaging of the abdomen and pelvis was performed following the standard protocol without IV contrast. RADIATION DOSE REDUCTION: This exam was performed according to the departmental dose-optimization program which includes automated exposure control, adjustment of the mA and/or kV according to patient size and/or use of iterative reconstruction technique. COMPARISON:  None Available. FINDINGS: Lower chest: No acute abnormality. Hepatobiliary: No focal liver abnormality is seen. Fatty infiltration of the liver is noted. No gallstones, gallbladder wall thickening, or biliary dilatation. Pancreas: Unremarkable. No pancreatic ductal dilatation or surrounding inflammatory changes. Spleen: Normal in size without focal abnormality. Adrenals/Urinary Tract: The adrenal glands are within normal limits. No renal calculus or hydronephrosis. There is mild bladder wall thickening. Stomach/Bowel: Stomach is within normal limits. Appendix appears normal. No evidence of bowel wall thickening, distention, or inflammatory changes. No free air or pneumatosis. Scattered diverticula are present along the colon without evidence of diverticulitis. Vascular/Lymphatic: Aortic atherosclerosis. No enlarged abdominal or pelvic lymph nodes. Reproductive: The uterus has a lobular contour suggesting underlying fibroids. No adnexal mass. Other: No abdominopelvic  ascites. Musculoskeletal: No acute osseous abnormality. IMPRESSION: 1. No acute intra-abdominal process. 2. Hepatic steatosis. 3. Diverticulosis without diverticulitis. 4. Uterine fibroids. 5. Aortic atherosclerosis. Electronically Signed   By: Brett Fairy M.D.   On: 06/13/2022 00:28   CT HEAD WO CONTRAST (5MM)  Result Date: 06/12/2022 CLINICAL DATA:  Syncope EXAM: CT HEAD WITHOUT CONTRAST TECHNIQUE: Contiguous axial images were obtained from the base of the skull through the vertex without intravenous contrast. RADIATION DOSE REDUCTION: This exam was performed according to the departmental dose-optimization program which includes automated exposure control, adjustment of the mA and/or kV according to patient size and/or use of iterative reconstruction technique. COMPARISON:  None Available. FINDINGS: Brain: No acute territorial infarction, hemorrhage or intracranial mass. Moderate atrophy. Minimal white matter hypodensity likely chronic small vessel ischemic change. Nonenlarged ventricles Vascular: No hyperdense vessels.  No unexpected calcification Skull: Normal. Negative for fracture or focal lesion. Sinuses/Orbits: No acute finding. Other: None IMPRESSION: 1. No CT evidence for acute intracranial abnormality. 2. Atrophy and minimal chronic small vessel ischemic change of the white matter. Electronically Signed   By: Donavan Foil M.D.   On: 06/12/2022 23:50    IMPRESSION/PLAN:  67 year old female admitted to the hospital 06/12/2022 with generalized weakness, lightheadedness and diarrhea x 1 episode with elevated LFTs, suspect viral etiology.  Head CT negative.  Acute hepatitis panel negative.  CMV IgM  < 30. EBV IgM pending. HIV negative. RUQ sonogram showed evidence of hepatic steatosis and gallbladder sludge without evidence of acute cholecystitis. CTAP without contrast showed hepatic steatosis without acute intra-abdominal/pelvic pathology to explain her symptoms.  T. Bili 1.8 -> 0.7. Alk phos 182 ->  124. AST 573 -> 179. ALT 174 ->83. -Diet as tolerated -Hepatic panel in a.m. -Likely  discharge tomorrow if LFTs continue to downtrend -  Await further recommendations per Dr. Candis Schatz  Macrocytic anemia. Admission Hg 13.3 -> 10.0.  Likely dilutional, no overt GI bleeding.  Acro cytosis possibly due to alcohol consumption. -Check vitamin B12 level  Leukopenia, WBC 4.5 -> 3.0, thrombocytopenia PLT 211 -> 114, likely viral etiology   Hypokalemia. K+ 2.9. -KCl replacement per the hospitalist   Noralyn Pick  06/14/2022, 10:07AM  I have taken a history, reviewed the chart and examined the patient. I performed a substantive portion of this encounter, including complete performance of at least one of the key components, in conjunction with the APP. I agree with the APP's note, impression and recommendations  67 year old female with no significant medical history, admitted with weakness, presyncope, tremulousness, found to have acute kidney injury and elevated liver enzymes in a mixed pattern, AST-predominant, with mild hyperbilirubinemia that resolved with hydration, macrocytic anemia and fatty liver changes on ultrasound.  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  Her cytopenias are also likely related to alcohol use.  Unclear if her episode of weakness and tremulousness was related to alcohol or an unrelated infection.  She is feeling much better today.   We discussed how alcohol is likely causing damage to her liver and she should consider cutting back or stopping completely to reduce risk of further liver damage.  No further inpatient evaluation recommended at this time.   Will arrange for outpatient follow up in 4 weeks and reassess liver enzymes and discuss further evaluation to include elastography  GI will sign off  Marvette Schamp E. Candis Schatz, MD [facility]  Gastroenterology

## 2022-06-15 DIAGNOSIS — R531 Weakness: Secondary | ICD-10-CM | POA: Diagnosis not present

## 2022-06-15 LAB — COMPREHENSIVE METABOLIC PANEL
ALT: 78 U/L — ABNORMAL HIGH (ref 0–44)
AST: 132 U/L — ABNORMAL HIGH (ref 15–41)
Albumin: 3 g/dL — ABNORMAL LOW (ref 3.5–5.0)
Alkaline Phosphatase: 117 U/L (ref 38–126)
Anion gap: 8 (ref 5–15)
BUN: 9 mg/dL (ref 8–23)
CO2: 23 mmol/L (ref 22–32)
Calcium: 8.4 mg/dL — ABNORMAL LOW (ref 8.9–10.3)
Chloride: 108 mmol/L (ref 98–111)
Creatinine, Ser: 0.63 mg/dL (ref 0.44–1.00)
GFR, Estimated: >60 mL/min (ref >60)
Glucose, Bld: 98 mg/dL (ref 70–99)
Potassium: 3.3 mmol/L — ABNORMAL LOW (ref 3.5–5.1)
Sodium: 139 mmol/L (ref 135–145)
Total Bilirubin: 0.6 mg/dL (ref 0.3–1.2)
Total Protein: 5.7 g/dL — ABNORMAL LOW (ref 6.5–8.1)

## 2022-06-15 LAB — CBC
HCT: 28 % — ABNORMAL LOW (ref 36.0–46.0)
Hemoglobin: 9.7 g/dL — ABNORMAL LOW (ref 12.0–15.0)
MCH: 38.2 pg — ABNORMAL HIGH (ref 26.0–34.0)
MCHC: 34.6 g/dL (ref 30.0–36.0)
MCV: 110.2 fL — ABNORMAL HIGH (ref 80.0–100.0)
Platelets: 133 10*3/uL — ABNORMAL LOW (ref 150–400)
RBC: 2.54 MIL/uL — ABNORMAL LOW (ref 3.87–5.11)
RDW: 15.8 % — ABNORMAL HIGH (ref 11.5–15.5)
WBC: 3.8 10*3/uL — ABNORMAL LOW (ref 4.0–10.5)
nRBC: 0 % (ref 0.0–0.2)

## 2022-06-15 LAB — MAGNESIUM: Magnesium: 1.8 mg/dL (ref 1.7–2.4)

## 2022-06-15 MED ORDER — FOLIC ACID 1 MG PO TABS: 1.0000 mg | ORAL_TABLET | Freq: Every day | ORAL | 1 refills | Status: AC

## 2022-06-15 MED ORDER — POLYETHYLENE GLYCOL 3350 17 G PO PACK: 17.0000 g | PACK | Freq: Every day | ORAL | 0 refills | Status: AC | PRN

## 2022-06-15 MED ORDER — DOCUSATE SODIUM 100 MG PO CAPS: 100.0000 mg | ORAL_CAPSULE | Freq: Two times a day (BID) | ORAL | 0 refills | Status: DC

## 2022-06-15 MED ORDER — POTASSIUM CHLORIDE CRYS ER 20 MEQ PO TBCR
40.0000 meq | EXTENDED_RELEASE_TABLET | Freq: Once | ORAL | Status: AC
  Administered 2022-06-15: 40 meq via ORAL
  Filled 2022-06-15: qty 2

## 2022-06-15 MED ORDER — VITAMIN B-1 100 MG PO TABS: 100.0000 mg | ORAL_TABLET | Freq: Every day | ORAL | 1 refills | Status: AC

## 2022-06-15 NOTE — Progress Notes (Signed)
PT Cancellation Note  Patient Details Name: Shaivi Deboe MRN: [id number] DOB: [birth year]   Cancelled Treatment:    Reason Eval/Treat Not Completed: Patient declined, no reason specified  Second follow-up attempt for PT evaluation. Patient politely declines PT services this admission. States she has been up mobilizing independently. Is hopeful to return home soon now that she is improving. All questions answered, pt has no concerns.  PT signing off.  Thank you,  Candie Mile, PT, DPT Physical Therapist Acute Rehabilitation Services [facility]  Ellouise Newer 06/15/2022, 11:56 AM

## 2022-06-15 NOTE — Discharge Summary (Signed)
Physician Discharge Summary  Patient ID: Stacey Oconnor MRN: [id number] DOB/AGE: [id number] y.o.  Admit date: 06/12/2022 Discharge date: 06/15/2022  Admission Diagnoses:  Discharge Diagnoses:  Principal Problem:   Generalized weakness Active Problems:   UTI (urinary tract infection)   AKI (acute kidney injury) (HCC)   Elevated LFTs Hypokalemia   Discharged Condition: stable  Hospital Course: Patient is a 67 year old female that presented with weakness.  Apparently, patient felt weak and lightheaded.  Patient was admitted for further assessment and management.  In the ED CT head was negative.  Neurology team was consulted for code stroke.  UA was consistent with UTI, abnormal LFTs was noted.  Patient was tremulous.  No urine culture was noted.  Patient was treated with IV antibiotics.  Liver function abnormality was suggestive of likely alcohol abuse.  GI team was also consulted to assist with patient's management.  Patient has been counseled to quit alcohol use.  Patient will need 30-day Holter monitor for lightheadedness rule out any underlying abnormal rhythm.   Generalized weakness: Multifactorial in the setting of alcohol use, electrolyte imbalance UTI acute renal failure: Patient symptomatically feels much improved.  Patient was adequately hydrated.  Patient was managed with antibiotics.     Hypokalemia Hypomagnesemia:   UTI: She was treated with IV Rocephin.  .   AKI: Resolved.   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     Alcohol use history patient admitted on CIWA scale-scale stable    Consults: GI  Significant Diagnostic Studies:  Low potassium.  Discharge Exam: Blood  pressure (!) 144/102, pulse 78, temperature 98.9 F (37.2 C), temperature source Oral, resp. rate 18, height 5' 6"$  (1.676 m), weight 76.7 kg, SpO2 100 %.   Disposition: Discharge disposition: 01-Home or Self Care       Discharge Instructions     Diet - low sodium heart healthy   Complete by: As directed    Increase activity slowly   Complete by: As directed       Allergies as of 06/15/2022   No Known Allergies      Medication List     STOP taking these medications    acetaminophen 500 MG tablet Commonly known as: TYLENOL   hydrOXYzine 25 MG tablet Commonly known as: ATARAX       TAKE these medications    docusate sodium 100 MG capsule Commonly known as: COLACE Take 1 capsule (100 mg total) by mouth 2 (two) times daily.   folic acid 1 MG tablet Commonly known as: FOLVITE Take 1 tablet (1 mg total) by mouth daily. Start taking on: June 16, 2022   multivitamin with minerals Tabs tablet Take 1 tablet by mouth daily.   polyethylene glycol 17 g packet Commonly known as: MIRALAX / GLYCOLAX Take 17 g by mouth daily as needed for mild constipation.   tetrahydrozoline 0.05 % ophthalmic solution Place 1 drop into both eyes daily as needed (dry eyes).   thiamine 100 MG tablet Commonly known as: Vitamin B-1 Take 1 tablet (100 mg total) by mouth daily. Start taking on: June 16, 2022   Vitamin D (Ergocalciferol) 1.25 MG (50000 UNIT) Caps capsule Commonly known as: DRISDOL Take 50,000 Units by  mouth once a week.         SignedBonnell Public 06/15/2022, 12:06 PM

## 2022-06-15 NOTE — Progress Notes (Signed)
Patient discharged to home with self care. She verbalized an understanding to all discharge instructions and left the unit via wheelchair in no distress.

## 2022-06-18 ENCOUNTER — Telehealth: Payer: Self-pay

## 2022-06-18 NOTE — Telephone Encounter (Signed)
Pt scheduled to see Dr. Candis Schatz 07/31/22 at 3:40pm. Appt letter mailed to pt.

## 2022-06-18 NOTE — Telephone Encounter (Signed)
-----   Message from Daryel November, MD sent at 06/14/2022  9:19 PM EST ----- Regarding: Hospital follow up Vaughan Basta,  Can you please arrange a routine office visit for follow up of hospitalization for elevated liver enzymes with me?

## 2022-06-30 ENCOUNTER — Other Ambulatory Visit: Payer: Self-pay

## 2022-06-30 ENCOUNTER — Encounter (HOSPITAL_COMMUNITY): Payer: Self-pay

## 2022-06-30 ENCOUNTER — Emergency Department (HOSPITAL_COMMUNITY)
Admission: EM | Admit: 2022-06-30 | Discharge: 2022-06-30 | Disposition: A | Discharge status: Home / Self Care | Payer: Medicare Other | Attending: Emergency Medicine | Admitting: Emergency Medicine

## 2022-06-30 DIAGNOSIS — Z1152 Encounter for screening for COVID-19: Secondary | ICD-10-CM | POA: Diagnosis not present

## 2022-06-30 DIAGNOSIS — R112 Nausea with vomiting, unspecified: Secondary | ICD-10-CM | POA: Diagnosis present

## 2022-06-30 DIAGNOSIS — R531 Weakness: Secondary | ICD-10-CM | POA: Diagnosis not present

## 2022-06-30 DIAGNOSIS — R197 Diarrhea, unspecified: Secondary | ICD-10-CM | POA: Diagnosis not present

## 2022-06-30 LAB — CBC WITH DIFFERENTIAL/PLATELET
Abs Immature Granulocytes: 0.01 10*3/uL (ref 0.00–0.07)
Basophils Absolute: 0 10*3/uL (ref 0.0–0.1)
Basophils Relative: 1 %
Eosinophils Absolute: 0 10*3/uL (ref 0.0–0.5)
Eosinophils Relative: 0 %
HCT: 32.6 % — ABNORMAL LOW (ref 36.0–46.0)
Hemoglobin: 11.3 g/dL — ABNORMAL LOW (ref 12.0–15.0)
Immature Granulocytes: 0 %
Lymphocytes Relative: 16 %
Lymphs Abs: 0.5 10*3/uL — ABNORMAL LOW (ref 0.7–4.0)
MCH: 37.7 pg — ABNORMAL HIGH (ref 26.0–34.0)
MCHC: 34.7 g/dL (ref 30.0–36.0)
MCV: 108.7 fL — ABNORMAL HIGH (ref 80.0–100.0)
Monocytes Absolute: 0.2 10*3/uL (ref 0.1–1.0)
Monocytes Relative: 7 %
Neutro Abs: 2.2 10*3/uL (ref 1.7–7.7)
Neutrophils Relative %: 76 %
Platelets: 212 10*3/uL (ref 150–400)
RBC: 3 MIL/uL — ABNORMAL LOW (ref 3.87–5.11)
RDW: 15.1 % (ref 11.5–15.5)
WBC: 2.9 10*3/uL — ABNORMAL LOW (ref 4.0–10.5)
nRBC: 0 % (ref 0.0–0.2)

## 2022-06-30 LAB — URINALYSIS, ROUTINE W REFLEX MICROSCOPIC
Glucose, UA: 50 mg/dL — AB
Hgb urine dipstick: NEGATIVE
Ketones, ur: 20 mg/dL — AB
Nitrite: NEGATIVE
Protein, ur: 100 mg/dL — AB
Specific Gravity, Urine: 1.027 (ref 1.005–1.030)
pH: 5 (ref 5.0–8.0)

## 2022-06-30 LAB — COMPREHENSIVE METABOLIC PANEL
ALT: 167 U/L — ABNORMAL HIGH (ref 0–44)
AST: 300 U/L — ABNORMAL HIGH (ref 15–41)
Albumin: 4.1 g/dL (ref 3.5–5.0)
Alkaline Phosphatase: 196 U/L — ABNORMAL HIGH (ref 38–126)
Anion gap: 15 (ref 5–15)
BUN: 12 mg/dL (ref 8–23)
CO2: 27 mmol/L (ref 22–32)
Calcium: 9.4 mg/dL (ref 8.9–10.3)
Chloride: 105 mmol/L (ref 98–111)
Creatinine, Ser: 1 mg/dL (ref 0.44–1.00)
GFR, Estimated: >60 mL/min (ref >60)
Glucose, Bld: 120 mg/dL — ABNORMAL HIGH (ref 70–99)
Potassium: 3.5 mmol/L (ref 3.5–5.1)
Sodium: 147 mmol/L — ABNORMAL HIGH (ref 135–145)
Total Bilirubin: 1.3 mg/dL — ABNORMAL HIGH (ref 0.3–1.2)
Total Protein: 7.7 g/dL (ref 6.5–8.1)

## 2022-06-30 LAB — RESP PANEL BY RT-PCR (RSV, FLU A&B, COVID)  RVPGX2
Influenza A by PCR: NEGATIVE
Influenza B by PCR: NEGATIVE
Resp Syncytial Virus by PCR: NEGATIVE
SARS Coronavirus 2 by RT PCR: NEGATIVE

## 2022-06-30 LAB — LIPASE, BLOOD: Lipase: 53 U/L — ABNORMAL HIGH (ref 11–51)

## 2022-06-30 LAB — MAGNESIUM: Magnesium: 1.4 mg/dL — ABNORMAL LOW (ref 1.7–2.4)

## 2022-06-30 MED ORDER — ACETAMINOPHEN 325 MG PO TABS
650.0000 mg | ORAL_TABLET | Freq: Once | ORAL | Status: AC
  Administered 2022-06-30: 650 mg via ORAL
  Filled 2022-06-30: qty 2

## 2022-06-30 MED ORDER — SODIUM CHLORIDE 0.9 % IV BOLUS
1000.0000 mL | Freq: Once | INTRAVENOUS | Status: AC
  Administered 2022-06-30: 1000 mL via INTRAVENOUS

## 2022-06-30 MED ORDER — LORAZEPAM 2 MG/ML IJ SOLN: 2.0000 mg | Freq: Once | INTRAMUSCULAR | Status: DC

## 2022-06-30 MED ORDER — LORAZEPAM 1 MG PO TABS
2.0000 mg | ORAL_TABLET | Freq: Once | ORAL | Status: AC
  Administered 2022-06-30: 2 mg via ORAL
  Filled 2022-06-30: qty 2

## 2022-06-30 MED ORDER — MAGNESIUM SULFATE 2 GM/50ML IV SOLN
2.0000 g | Freq: Once | INTRAVENOUS | Status: DC
  Filled 2022-06-30: qty 50

## 2022-06-30 MED ORDER — SODIUM CHLORIDE 0.9 % IV SOLN
1.0000 g | Freq: Once | INTRAVENOUS | Status: AC
  Administered 2022-06-30: 1 g via INTRAVENOUS
  Filled 2022-06-30: qty 10

## 2022-06-30 MED ORDER — CEPHALEXIN 500 MG PO CAPS: 500.0000 mg | ORAL_CAPSULE | Freq: Four times a day (QID) | ORAL | 0 refills | Status: DC

## 2022-06-30 MED ORDER — THIAMINE HCL 100 MG PO TABS
100.0000 mg | ORAL_TABLET | Freq: Once | ORAL | Status: AC
  Administered 2022-06-30: 100 mg via ORAL
  Filled 2022-06-30 (×2): qty 1

## 2022-06-30 MED ORDER — MAGNESIUM OXIDE -MG SUPPLEMENT 400 (240 MG) MG PO TABS
800.0000 mg | ORAL_TABLET | Freq: Once | ORAL | Status: AC
  Administered 2022-06-30: 800 mg via ORAL
  Filled 2022-06-30: qty 2

## 2022-06-30 MED ORDER — LORAZEPAM 2 MG/ML IJ SOLN: 1.0000 mg | Freq: Once | INTRAMUSCULAR | Status: DC

## 2022-06-30 NOTE — ED Notes (Signed)
Patient drank 12oz of water without difficulty.

## 2022-06-30 NOTE — ED Triage Notes (Signed)
Patient complains of vomiting immediately yesterday after any intake. States she feels as if passing to stomach but immediate vomiting. Diarrhea a few episodes with same. No abdominal pain, no fever, chills yesterday. Has never had any stricture with esophagus and doesn't feel as if something stuck

## 2022-06-30 NOTE — Discharge Instructions (Addendum)
As we discussed, it does appear that you do still have a UTI.  I have given you a prescription for an antibiotic for you to take as prescribed in its entirety for management of this.  It is very important that you stay hydrated. Follow up with your primary care doctor as needed.  Return if development of any new or worsening symptoms

## 2022-06-30 NOTE — ED Provider Notes (Signed)
[facility] Provider Note   CSN: [id number] Arrival date & time: 06/30/22  1225     History  No chief complaint on file.   Stacey Oconnor is a 67 y.o. female.  Patient with history of alcohol abuse presents today with complaints of nausea and vomiting. She states that same began yesterday and has been persistent since. She is not able to eat because every time she tries she immediately vomits. She is not nauseous or vomiting until she eats something. She does not feel like the food gets stuck in her throat. Denies hematemesis. She endorses some throat irritation from vomiting but denies any abdominal pain. Endorses some diarrhea a few days ago but none since. Last bowel movement was this morning and was normal for her without hematochezia or melena. She was admitted recently for AKI and UTI, states that she is no longer having any urinary symptoms. She endorses feeling weak due to not being able to eat anything.  Denies any history of abdominal surgeries. She states that she last had alcohol 1 week ago. She is tremulous on exam, states that is because she is cold and hasn't eaten anything. Of note, when patient was admitted on 2/15, it was documented that she was tremulous at that time as well. She denies fevers, chills, chest pain, shortness of breath. Denies any history of similar symptoms previously.  The history is provided by the patient. No language interpreter was used.       Home Medications Prior to Admission medications   Medication Sig Start Date End Date Taking? Authorizing Provider  docusate sodium (COLACE) 100 MG capsule Take 1 capsule (100 mg total) by mouth 2 (two) times daily. 06/15/22   Bonnell Public, MD  folic acid (FOLVITE) 1 MG tablet Take 1 tablet (1 mg total) by mouth daily. 06/16/22 08/15/22  Bonnell Public, MD  Multiple Vitamin (MULTIVITAMIN WITH MINERALS) TABS tablet Take 1 tablet by mouth daily.    [provider]  polyethylene glycol (MIRALAX / GLYCOLAX) 17 g packet Take 17 g by mouth daily as needed for mild constipation. 06/15/22   Dana Allan I, MD  tetrahydrozoline 0.05 % ophthalmic solution Place 1 drop into both eyes daily as needed (dry eyes).    [provider]  thiamine (VITAMIN B-1) 100 MG tablet Take 1 tablet (100 mg total) by mouth daily. 06/16/22 08/15/22  Bonnell Public, MD  Vitamin D, Ergocalciferol, (DRISDOL) 1.25 MG (50000 UNIT) CAPS capsule Take 50,000 Units by mouth once a week. 03/29/22   [provider]      Allergies    Patient has no known allergies.    Review of Systems   Review of Systems  Gastrointestinal:  Positive for diarrhea, nausea and vomiting.  Neurological:  Positive for weakness.  All other systems reviewed and are negative.   Physical Exam Updated Vital Signs BP (!) 159/99 (BP Location: Right Arm)   Pulse (!) 108   Temp 99.9 F (37.7 C) (Oral)   Resp 16   Ht '5\' 6"'$  (1.676 m)   Wt 60.8 kg   SpO2 100%   BMI 21.63 kg/m  Physical Exam Vitals and nursing note reviewed.  Constitutional:      General: She is not in acute distress.    Appearance: Normal appearance. She is normal weight. She is not ill-appearing, toxic-appearing or diaphoretic.  HENT:     Head: Normocephalic and atraumatic.  Cardiovascular:  Rate and Rhythm: Normal rate.  Pulmonary:     Effort: Pulmonary effort is normal. No respiratory distress.  Abdominal:     General: Abdomen is flat.     Palpations: Abdomen is soft.     Tenderness: There is no abdominal tenderness.  Musculoskeletal:        General: Normal range of motion.     Cervical back: Normal range of motion.  Skin:    General: Skin is warm and dry.  Neurological:     General: No focal deficit present.     Mental Status: She is alert.  Psychiatric:        Mood and Affect: Mood normal.        Behavior: Behavior normal.     ED Results / Procedures / Treatments   Labs (all  labs ordered are listed, but only abnormal results are displayed) Labs Reviewed  COMPREHENSIVE METABOLIC PANEL - Abnormal; Notable for the following components:      Result Value   Sodium 147 (*)    Glucose, Bld 120 (*)    AST 300 (*)    ALT 167 (*)    Alkaline Phosphatase 196 (*)    Total Bilirubin 1.3 (*)    All other components within normal limits  LIPASE, BLOOD - Abnormal; Notable for the following components:   Lipase 53 (*)    All other components within normal limits  URINALYSIS, ROUTINE W REFLEX MICROSCOPIC - Abnormal; Notable for the following components:   Color, Urine AMBER (*)    APPearance CLOUDY (*)    Glucose, UA 50 (*)    Bilirubin Urine MODERATE (*)    Ketones, ur 20 (*)    Protein, ur 100 (*)    Leukocytes,Ua LARGE (*)    Bacteria, UA RARE (*)    All other components within normal limits  MAGNESIUM - Abnormal; Notable for the following components:   Magnesium 1.4 (*)    All other components within normal limits  CBC WITH DIFFERENTIAL/PLATELET - Abnormal; Notable for the following components:   WBC 2.9 (*)    RBC 3.00 (*)    Hemoglobin 11.3 (*)    HCT 32.6 (*)    MCV 108.7 (*)    MCH 37.7 (*)    Lymphs Abs 0.5 (*)    All other components within normal limits  RESP PANEL BY RT-PCR (RSV, FLU A&B, COVID)  [id number]  CBC WITH DIFFERENTIAL/PLATELET    EKG None  Radiology No results found.  Procedures Procedures    Medications Ordered in ED Medications  magnesium sulfate IVPB 2 g 50 mL (has no administration in time range)  acetaminophen (TYLENOL) tablet 650 mg (650 mg Oral Given 06/30/22 1336)  sodium chloride 0.9 % bolus 1,000 mL (1,000 mLs Intravenous New Bag/Given 06/30/22 1337)    ED Course/ Medical Decision Making/ A&P                             Medical Decision Making Amount and/or Complexity of Data Reviewed Labs: ordered.  Risk OTC drugs. Prescription drug management.   This patient is a 67 y.o. female who presents to the ED for  concern of nausea vomiting this involves an extensive number of treatment options, and is a complaint that carries with it a high risk of complications and morbidity. The emergent differential diagnosis prior to evaluation includes, but is not limited to, gastroenteritis, appendicitis, Bowel obstruction, Bowel perforation. Gastroparesis, DKA, Inflammatory bowel  disease, pancreatitis, peritonitis SBP.  This is not an exhaustive differential.   Past Medical History / Co-morbidities / Social History: Hx alcohol abuse  Additional history: Chart reviewed. Pertinent results include: recently admitted with UTI, AKI, and elevated LFTs. Negative hepatitis panel and imaging at that time. Thought to be related to patients alcohol abuse   Physical Exam: Physical exam performed. The pertinent findings include: abdomen soft and non-tender. Patient tremulous  Lab Tests: I ordered, and personally interpreted labs.  The pertinent results include:  magnesium 1.4, lipase 53, Na 147, liver enzymes elevated but improved from previous. WBC 2.9. Hgb 11.3 improved from previous   Medications: I ordered medication including tylenol, rocephin, ativan, magnesium, fluids, thiamine  for temp 99.9, UTI, alcohol abuse/withdrawal, dehydration. Reevaluation of the patient after these medicines showed that the patient resolved. I have reviewed the patients home medicines and have made adjustments as needed.    Disposition:  Patient presents today with nausea and vomiting.  She is nontoxic-appearing, and in no acute distress with reassuring vital signs.  Physical exam reveals abdomen that is soft and nontender.  She was mildly tremulous and did deny any recent alcohol use in the last week, however suspect she may be going through withdraws given symptoms. Resolved with ativan.  Laboratory evaluation reassuring.  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 Low suspicion for acute liver failure,  cholecystitis, choledocholithiasis, or other emergent etiology. She does appear to still have a UTI, will treat for same with Keflex.  After medications and fluids, patient states that she is feeling much better and would like to go home. She is no longer tremulous. She is able to eat and drink without any residual episodes of nausea or vomiting. Evaluation and diagnostic testing in the emergency department does not suggest an emergent condition requiring admission or immediate intervention beyond what has been performed at this time.  Plan for discharge with close PCP follow-up.  Patient is understanding and amenable with plan, educated on red flag symptoms that would prompt immediate return.  Patient discharged in stable condition.   This is a shared visit with supervising physician Dr. Nechama Guard who has independently evaluated patient & provided guidance in evaluation/management/disposition, in agreement with care    Final Clinical Impression(s) / ED Diagnoses Final diagnoses:  Nausea and vomiting, unspecified vomiting type  Hypomagnesemia    Rx / DC Orders ED Discharge Orders          Ordered    cephALEXin (KEFLEX) 500 MG capsule  4 times daily        06/30/22 1825          An After Visit Summary was printed and given to the patient.     Nestor Lewandowsky 06/30/22 Blanchie Dessert, MD 06/30/22 2031

## 2022-07-09 ENCOUNTER — Other Ambulatory Visit: Payer: Self-pay | Admitting: Internal Medicine

## 2022-07-31 ENCOUNTER — Ambulatory Visit: Payer: Medicare Other | Admitting: Gastroenterology

## 2023-01-10 ENCOUNTER — Ambulatory Visit (HOSPITAL_COMMUNITY)
Admission: EM | Admit: 2023-01-10 | Discharge: 2023-01-10 | Discharge status: Against Medical Advice | Payer: Medicare HMO

## 2023-01-26 ENCOUNTER — Emergency Department (HOSPITAL_COMMUNITY): Payer: Medicare HMO

## 2023-01-26 ENCOUNTER — Inpatient Hospital Stay (HOSPITAL_COMMUNITY)
Admission: EM | Admit: 2023-01-26 | Discharge: 2023-01-29 | DRG: 683 | Disposition: A | Discharge status: Home / Self Care | Payer: Medicare HMO | Attending: Internal Medicine | Admitting: Internal Medicine

## 2023-01-26 ENCOUNTER — Other Ambulatory Visit: Payer: Self-pay

## 2023-01-26 ENCOUNTER — Encounter (HOSPITAL_COMMUNITY): Payer: Self-pay | Admitting: *Deleted

## 2023-01-26 DIAGNOSIS — Z79899 Other long term (current) drug therapy: Secondary | ICD-10-CM

## 2023-01-26 DIAGNOSIS — R112 Nausea with vomiting, unspecified: Secondary | ICD-10-CM | POA: Diagnosis present

## 2023-01-26 DIAGNOSIS — R197 Diarrhea, unspecified: Secondary | ICD-10-CM

## 2023-01-26 DIAGNOSIS — E8729 Other acidosis: Secondary | ICD-10-CM | POA: Diagnosis not present

## 2023-01-26 DIAGNOSIS — E876 Hypokalemia: Secondary | ICD-10-CM | POA: Diagnosis present

## 2023-01-26 DIAGNOSIS — R531 Weakness: Secondary | ICD-10-CM

## 2023-01-26 DIAGNOSIS — E860 Dehydration: Secondary | ICD-10-CM | POA: Diagnosis present

## 2023-01-26 DIAGNOSIS — N179 Acute kidney failure, unspecified: Principal | ICD-10-CM | POA: Diagnosis present

## 2023-01-26 DIAGNOSIS — K529 Noninfective gastroenteritis and colitis, unspecified: Secondary | ICD-10-CM | POA: Diagnosis present

## 2023-01-26 DIAGNOSIS — F1011 Alcohol abuse, in remission: Secondary | ICD-10-CM | POA: Diagnosis not present

## 2023-01-26 DIAGNOSIS — W19XXXA Unspecified fall, initial encounter: Secondary | ICD-10-CM | POA: Diagnosis present

## 2023-01-26 DIAGNOSIS — F1010 Alcohol abuse, uncomplicated: Secondary | ICD-10-CM | POA: Diagnosis present

## 2023-01-26 DIAGNOSIS — R296 Repeated falls: Secondary | ICD-10-CM | POA: Diagnosis present

## 2023-01-26 HISTORY — DX: Nausea with vomiting, unspecified: R11.2

## 2023-01-26 LAB — COMPREHENSIVE METABOLIC PANEL
ALT: 19 U/L (ref 0–44)
AST: 39 U/L (ref 15–41)
Albumin: 3.2 g/dL — ABNORMAL LOW (ref 3.5–5.0)
Alkaline Phosphatase: 126 U/L (ref 38–126)
Anion gap: 18 — ABNORMAL HIGH (ref 5–15)
BUN: 42 mg/dL — ABNORMAL HIGH (ref 8–23)
CO2: 25 mmol/L (ref 22–32)
Calcium: 9.3 mg/dL (ref 8.9–10.3)
Chloride: 93 mmol/L — ABNORMAL LOW (ref 98–111)
Creatinine, Ser: 3.33 mg/dL — ABNORMAL HIGH (ref 0.44–1.00)
GFR, Estimated: 15 mL/min — ABNORMAL LOW (ref >60)
Glucose, Bld: 131 mg/dL — ABNORMAL HIGH (ref 70–99)
Potassium: 2.7 mmol/L — CL (ref 3.5–5.1)
Sodium: 136 mmol/L (ref 135–145)
Total Bilirubin: 1 mg/dL (ref 0.3–1.2)
Total Protein: 7.8 g/dL (ref 6.5–8.1)

## 2023-01-26 LAB — LIPASE, BLOOD: Lipase: 30 U/L (ref 11–51)

## 2023-01-26 LAB — CBC
HCT: 35.3 % — ABNORMAL LOW (ref 36.0–46.0)
Hemoglobin: 12.3 g/dL (ref 12.0–15.0)
MCH: 36.8 pg — ABNORMAL HIGH (ref 26.0–34.0)
MCHC: 34.8 g/dL (ref 30.0–36.0)
MCV: 105.7 fL — ABNORMAL HIGH (ref 80.0–100.0)
Platelets: 414 10*3/uL — ABNORMAL HIGH (ref 150–400)
RBC: 3.34 MIL/uL — ABNORMAL LOW (ref 3.87–5.11)
RDW: 14.9 % (ref 11.5–15.5)
WBC: 6.6 10*3/uL (ref 4.0–10.5)
nRBC: 0 % (ref 0.0–0.2)

## 2023-01-26 MED ORDER — SODIUM CHLORIDE 0.9 % IV BOLUS
1000.0000 mL | Freq: Once | INTRAVENOUS | Status: AC
  Administered 2023-01-26: 1000 mL via INTRAVENOUS

## 2023-01-26 MED ORDER — ONDANSETRON HCL 4 MG PO TABS: 4.0000 mg | ORAL_TABLET | Freq: Four times a day (QID) | ORAL | Status: DC | PRN

## 2023-01-26 MED ORDER — ONDANSETRON HCL 4 MG/2ML IJ SOLN
4.0000 mg | Freq: Once | INTRAMUSCULAR | Status: AC
  Administered 2023-01-26: 4 mg via INTRAVENOUS
  Filled 2023-01-26: qty 2

## 2023-01-26 MED ORDER — LORAZEPAM 2 MG/ML IJ SOLN: 1.0000 mg | INTRAMUSCULAR | Status: DC | PRN

## 2023-01-26 MED ORDER — THIAMINE HCL 100 MG/ML IJ SOLN: 100.0000 mg | Freq: Every day | INTRAMUSCULAR | Status: DC

## 2023-01-26 MED ORDER — ADULT MULTIVITAMIN W/MINERALS CH: 1.0000 | ORAL_TABLET | Freq: Every day | ORAL | Status: DC

## 2023-01-26 MED ORDER — LACTATED RINGERS IV SOLN: INTRAVENOUS | Status: AC

## 2023-01-26 MED ORDER — LORAZEPAM 1 MG PO TABS: 1.0000 mg | ORAL_TABLET | ORAL | Status: DC | PRN

## 2023-01-26 MED ORDER — THIAMINE MONONITRATE 100 MG PO TABS: 100.0000 mg | ORAL_TABLET | Freq: Every day | ORAL | Status: DC

## 2023-01-26 MED ORDER — ONDANSETRON HCL 4 MG/2ML IJ SOLN: 4.0000 mg | Freq: Four times a day (QID) | INTRAMUSCULAR | Status: DC | PRN

## 2023-01-26 MED ORDER — POTASSIUM CHLORIDE 10 MEQ/100ML IV SOLN
10.0000 meq | INTRAVENOUS | Status: AC
  Administered 2023-01-26 (×2): 10 meq via INTRAVENOUS
  Filled 2023-01-26 (×2): qty 100

## 2023-01-26 MED ORDER — NAPHAZOLINE-GLYCERIN 0.012-0.25 % OP SOLN: 1.0000 [drp] | Freq: Four times a day (QID) | OPHTHALMIC | Status: DC | PRN

## 2023-01-26 MED ORDER — FOLIC ACID 1 MG PO TABS: 1.0000 mg | ORAL_TABLET | Freq: Every day | ORAL | Status: DC

## 2023-01-26 MED ORDER — ACETAMINOPHEN 325 MG PO TABS: 650.0000 mg | ORAL_TABLET | Freq: Four times a day (QID) | ORAL | Status: DC | PRN

## 2023-01-26 MED ORDER — ENOXAPARIN SODIUM 30 MG/0.3ML IJ SOSY
30.0000 mg | PREFILLED_SYRINGE | INTRAMUSCULAR | Status: DC
  Administered 2023-01-27 – 2023-01-29 (×3): 30 mg via SUBCUTANEOUS
  Filled 2023-01-26 (×3): qty 0.3

## 2023-01-26 MED ORDER — FAMOTIDINE IN NACL 20-0.9 MG/50ML-% IV SOLN
20.0000 mg | Freq: Once | INTRAVENOUS | Status: AC
  Administered 2023-01-26: 20 mg via INTRAVENOUS
  Filled 2023-01-26: qty 50

## 2023-01-26 MED ORDER — ACETAMINOPHEN 650 MG RE SUPP: 650.0000 mg | Freq: Four times a day (QID) | RECTAL | Status: DC | PRN

## 2023-01-26 NOTE — ED Notes (Signed)
Attempted to call [phone] to give report. Was informed RN is in a pt's room. Callback number left.

## 2023-01-26 NOTE — ED Triage Notes (Addendum)
Pt here via [facility] from home.  States she has been experiencing decreased mobility and malaise since being discharged from the hospital several weeks ago.  Today she woke up with nausea, vomiting and diarrhea.  Pt states she has fallen several times today, once hurting her R knee and once losing consciousness. Denies hitting head. VS"  Hr 110  BP 100/50 Spo2 98% Cbg 178

## 2023-01-26 NOTE — ED Provider Notes (Signed)
[place] EMERGENCY DEPARTMENT AT [facility] Provider Note   CSN: [id number] Arrival date & time: 01/26/23  1736     History {Add pertinent medical, surgical, social history, OB history to HPI:1} Chief Complaint  Patient presents with   Emesis   Fall    Kela Baccari is a 67 y.o. female.  HPI   67 year old female presents emergency department with complaint of generalized weakness, nausea/vomiting/diarrhea and frequent falls.  Patient states that she has minimal appetite, if anything she eats or drinks either comes right back up or causes diarrhea.  She feels generalized fatigue and is complaining of right knee pain from this last mechanical fall.  No head injury or syncope.  Denies any fever but endorses chills.  No acute chest pain or genitourinary symptoms.  Home Medications Prior to Admission medications   Medication Sig Start Date End Date Taking? Authorizing Provider  Multiple Vitamin (MULTIVITAMIN WITH MINERALS) TABS tablet Take 1 tablet by mouth daily.   Yes [provider]  polyethylene glycol (MIRALAX / GLYCOLAX) 17 g packet Take 17 g by mouth daily as needed for mild constipation. 06/15/22  Yes Berton Mount I, MD  tetrahydrozoline 0.05 % ophthalmic solution Place 1 drop into both eyes daily as needed (dry eyes).   Yes [provider]  Thiamine HCl (VITAMIN B-1 PO) Take 1 tablet by mouth daily.   Yes [provider]  VITAMIN A PO Take 1 tablet by mouth daily.   Yes [provider]  Vitamin D, Ergocalciferol, (DRISDOL) 1.25 MG (50000 UNIT) CAPS capsule Take 50,000 Units by mouth once a week. 03/29/22  Yes [provider]  folic acid (FOLVITE) 1 MG tablet Take 1 mg by mouth daily. Patient not taking: Reported on 01/26/2023 12/09/22   [provider]  hydrOXYzine (ATARAX) 25 MG tablet Take 25 mg by mouth every evening. Patient not taking: Reported on 01/26/2023 09/09/22   [provider]       Allergies    Patient has no known allergies.    Review of Systems   Review of Systems  Constitutional:  Positive for appetite change, chills and fatigue. Negative for fever.  Respiratory:  Negative for shortness of breath.   Cardiovascular:  Negative for chest pain.  Gastrointestinal:  Positive for abdominal pain, diarrhea, nausea and vomiting. Negative for blood in stool.  Genitourinary:  Negative for difficulty urinating and dysuria.  Skin:  Negative for rash.  Neurological:  Negative for headaches.    Physical Exam Updated Vital Signs BP 114/84 (BP Location: Left Arm)   Pulse 90   Temp 98.4 F (36.9 C) (Oral)   Resp 14   Ht 5\' 6"  (1.676 m)   Wt 60.8 kg   SpO2 100%   BMI 21.63 kg/m  Physical Exam Vitals and nursing note reviewed.  Constitutional:      General: She is not in acute distress.    Appearance: Normal appearance.  HENT:     Head: Normocephalic.     Mouth/Throat:     Mouth: Mucous membranes are moist.  Cardiovascular:     Rate and Rhythm: Normal rate.  Pulmonary:     Effort: Pulmonary effort is normal. No respiratory distress.  Abdominal:     General: There is no distension.     Palpations: Abdomen is soft.     Tenderness: There is abdominal tenderness. There is no guarding or rebound.  Skin:    General: Skin is warm.  Neurological:  Mental Status: She is alert and oriented to person, place, and time. Mental status is at baseline.  Psychiatric:        Mood and Affect: Mood normal.     ED Results / Procedures / Treatments   Labs (all labs ordered are listed, but only abnormal results are displayed) Labs Reviewed  COMPREHENSIVE METABOLIC PANEL - Abnormal; Notable for the following components:      Result Value   Potassium 2.7 (*)    Chloride 93 (*)    Glucose, Bld 131 (*)    BUN 42 (*)    Creatinine, Ser 3.33 (*)    Albumin 3.2 (*)    GFR, Estimated 15 (*)    Anion gap 18 (*)    All other components within normal limits  CBC -  Abnormal; Notable for the following components:   RBC 3.34 (*)    HCT 35.3 (*)    MCV 105.7 (*)    MCH 36.8 (*)    Platelets 414 (*)    All other components within normal limits  LIPASE, BLOOD  URINALYSIS, ROUTINE W REFLEX MICROSCOPIC    EKG EKG Interpretation Date/Time:  Sunday January 26 2023 18:56:49 EDT Ventricular Rate:  89 PR Interval:  174 QRS Duration:  74 QT Interval:  387 QTC Calculation: 471 R Axis:   66  Text Interpretation: Sinus rhythm Low voltage, precordial leads Confirmed by Coralee Pesa [phone]) on 01/26/2023 7:05:02 PM  Radiology CT ABDOMEN PELVIS WO CONTRAST  Result Date: 01/26/2023 CLINICAL DATA:  Abdominal pain, acute, nonlocalized. Nausea vomiting and diarrhea. Acute kidney injury. EXAM: CT ABDOMEN AND PELVIS WITHOUT CONTRAST TECHNIQUE: Multidetector CT imaging of the abdomen and pelvis was performed following the standard protocol without IV contrast. RADIATION DOSE REDUCTION: This exam was performed according to the departmental dose-optimization program which includes automated exposure control, adjustment of the mA and/or kV according to patient size and/or use of iterative reconstruction technique. COMPARISON:  06/13/2022 FINDINGS: Lower chest: Lung bases are clear. Hepatobiliary: Liver parenchyma appears normal without contrast. No calcified gallstones. Previously seen steatosis has largely resolved. Pancreas: Normal Spleen: Normal Adrenals/Urinary Tract: Adrenal glands are normal. Kidneys are normal by CT. Normal size. No mass, stone or hydronephrosis. Bladder is normal. Stomach/Bowel: Small hiatal hernia. Stomach otherwise normal. Normal small bowel appearance. Mild diverticulosis of the left colon but without evidence of diverticulitis. Vascular/Lymphatic: Aortic atherosclerosis. No aneurysm. IVC is normal. No adenopathy. Reproductive: No pelvic mass.  Small calcified leiomyomas. Other: No free fluid or air. Musculoskeletal: Normal IMPRESSION: 1. No acute  finding. No abnormality seen to explain the clinical presentation. 2. Small hiatal hernia. Mild diverticulosis of the left colon but without evidence of diverticulitis. 3. Aortic atherosclerosis. 4. Small calcified leiomyomas of the uterus. Aortic Atherosclerosis (ICD10-I70.0). Electronically Signed   By: Paulina Fusi M.D.   On: 01/26/2023 21:35   DG Knee Complete 4 Views Right  Result Date: 01/26/2023 CLINICAL DATA:  Recent fall with right knee pain, initial encounter EXAM: RIGHT KNEE - COMPLETE 4+ VIEW COMPARISON:  None Available. FINDINGS: No evidence of fracture, dislocation, or joint effusion. No evidence of arthropathy or other focal bone abnormality. Soft tissues are unremarkable. IMPRESSION: No acute abnormality noted. Electronically Signed   By: Alcide Clever M.D.   On: 01/26/2023 19:27    Procedures Procedures  {Document cardiac monitor, telemetry assessment procedure when appropriate:1}  Medications Ordered in ED Medications  potassium chloride 10 mEq in 100 mL IVPB (10 mEq Intravenous New Bag/Given 01/26/23 2047)  sodium  chloride 0.9 % bolus 1,000 mL (0 mLs Intravenous Stopped 01/26/23 2019)  ondansetron (ZOFRAN) injection 4 mg (4 mg Intravenous Given 01/26/23 2042)  famotidine (PEPCID) IVPB 20 mg premix (20 mg Intravenous New Bag/Given 01/26/23 2048)    ED Course/ Medical Decision Making/ A&P   {   Click here for ABCD2, HEART and other calculatorsREFRESH Note before signing :1}                              Medical Decision Making Amount and/or Complexity of Data Reviewed Labs: ordered. Radiology: ordered.  Risk Prescription drug management.   ***  {Document critical care time when appropriate:1} {Document review of labs and clinical decision tools ie heart score, Chads2Vasc2 etc:1}  {Document your independent review of radiology images, and any outside records:1} {Document your discussion with family members, caretakers, and with consultants:1} {Document social  determinants of health affecting pt's care:1} {Document your decision making why or why not admission, treatments were needed:1} Final Clinical Impression(s) / ED Diagnoses Final diagnoses:  None    Rx / DC Orders ED Discharge Orders     None

## 2023-01-26 NOTE — ED Notes (Signed)
ED TO INPATIENT HANDOFF REPORT  ED Nurse Name and Phone #: Juliette Alcide RN [phone]  S Name/Age/Gender Stacey Oconnor 67 y.o. female Room/Bed: 017C/017C  Code Status   Code Status: Full Code  Home/SNF/Other Home Patient oriented to: self, place, time, and situation Is this baseline? Yes   Triage Complete: Triage complete  Chief Complaint AKI (acute kidney injury) (HCC) [N17.9]  Triage Note Pt here via [facility] from home.  States she has been experiencing decreased mobility and malaise since being discharged from the hospital several weeks ago.  Today she woke up with nausea, vomiting and diarrhea.  Pt states she has fallen several times today, once hurting her R knee and once losing consciousness. Denies hitting head. VS"  Hr 110  BP 100/50 Spo2 98% Cbg 178   Allergies No Known Allergies  Level of Care/Admitting Diagnosis ED Disposition     ED Disposition  Admit   Condition  --   Comment  Hospital Area: [facility] [[id number]]  Level of Care: Telemetry Medical [[id number]]  May place patient in observation at [facility] or [facility] if equivalent level of care is available:: No  Covid Evaluation: Asymptomatic - no recent exposure (last 10 days) testing not required  Diagnosis: AKI (acute kidney injury) [facility]) [[id number]]  Admitting Physician: Hillary Bow [phone]  Attending Physician: Hillary Bow [[id number]]          B Medical/Surgery History Past Medical History:  Diagnosis Date   Ankle fracture    Left   Nausea vomiting and diarrhea 01/26/2023   Past Surgical History:  Procedure Laterality Date   ABSCESS DRAINAGE     Left Buttock   ORIF ANKLE FRACTURE Left 09/26/2018   Procedure: OPEN REDUCTION INTERNAL FIXATION (ORIF) ANKLE FRACTURE;  Surgeon: Cammy Copa, MD;  Location: [facility] OR;  Service: Orthopedics;  Laterality: Left;     A IV Location/Drains/Wounds Patient Lines/Drains/Airways Status     Active Line/Drains/Airways     Name Placement  date Placement time Site Days   Peripheral IV 01/26/23 20 G Anterior;Right Forearm 01/26/23  1820  Forearm  less than 1            Intake/Output Last 24 hours  Intake/Output Summary (Last 24 hours) at 01/26/2023 2347 Last data filed at 01/26/2023 2346 Gross per 24 hour  Intake 1261.19 ml  Output --  Net 1261.19 ml    Labs/Imaging Results for orders placed or performed during the hospital encounter of 01/26/23 (from the past 48 hour(s))  Lipase, blood     Status: None   Collection Time: 01/26/23  6:30 PM  Result Value Ref Range   Lipase 30 11 - 51 U/L    Comment: Performed at [facility], [address], [place], [place] [address]  Comprehensive metabolic panel     Status: Abnormal   Collection Time: 01/26/23  6:30 PM  Result Value Ref Range   Sodium 136 135 - 145 mmol/L   Potassium 2.7 (LL) 3.5 - 5.1 mmol/L    Comment: CRITICAL RESULT CALLED TO, READ BACK BY AND VERIFIED WITH Angela Adam RN AT [phone] [id number] BY D LONG   Chloride 93 (L) 98 - 111 mmol/L   CO2 25 22 - 32 mmol/L   Glucose, Bld 131 (H) 70 - 99 mg/dL    Comment: Glucose reference range applies only to samples taken after fasting for at least 8 hours.   BUN 42 (H) 8 - 23 mg/dL   Creatinine, Ser  3.33 (H) 0.44 - 1.00 mg/dL   Calcium 9.3 8.9 - 16.1 mg/dL   Total Protein 7.8 6.5 - 8.1 g/dL   Albumin 3.2 (L) 3.5 - 5.0 g/dL   AST 39 15 - 41 U/L   ALT 19 0 - 44 U/L   Alkaline Phosphatase 126 38 - 126 U/L   Total Bilirubin 1.0 0.3 - 1.2 mg/dL   GFR, Estimated 15 (L) >60 mL/min    Comment: (NOTE) Calculated using the CKD-EPI Creatinine Equation (2021)    Anion gap 18 (H) 5 - 15    Comment: Performed at [facility], [address] [place], [place] [address]  CBC     Status: Abnormal   Collection Time: 01/26/23  6:30 PM  Result Value Ref Range   WBC 6.6 4.0 - 10.5 K/uL   RBC 3.34 (L) 3.87 - 5.11 MIL/uL   Hemoglobin 12.3 12.0 - 15.0 g/dL   HCT 54.0 (L) 98.1 - 19.1 %   MCV 105.7 (H) 80.0 - 100.0 fL    MCH 36.8 (H) 26.0 - 34.0 pg   MCHC 34.8 30.0 - 36.0 g/dL   RDW 47.8 29.5 - 62.1 %   Platelets 414 (H) 150 - 400 K/uL   nRBC 0.0 0.0 - 0.2 %    Comment: Performed at [facility], [address], [place], [place] [address]   CT ABDOMEN PELVIS WO CONTRAST  Result Date: 01/26/2023 CLINICAL DATA:  Abdominal pain, acute, nonlocalized. Nausea vomiting and diarrhea. Acute kidney injury. EXAM: CT ABDOMEN AND PELVIS WITHOUT CONTRAST TECHNIQUE: Multidetector CT imaging of the abdomen and pelvis was performed following the standard protocol without IV contrast. RADIATION DOSE REDUCTION: This exam was performed according to the departmental dose-optimization program which includes automated exposure control, adjustment of the mA and/or kV according to patient size and/or use of iterative reconstruction technique. COMPARISON:  06/13/2022 FINDINGS: Lower chest: Lung bases are clear. Hepatobiliary: Liver parenchyma appears normal without contrast. No calcified gallstones. Previously seen steatosis has largely resolved. Pancreas: Normal Spleen: Normal Adrenals/Urinary Tract: Adrenal glands are normal. Kidneys are normal by CT. Normal size. No mass, stone or hydronephrosis. Bladder is normal. Stomach/Bowel: Small hiatal hernia. Stomach otherwise normal. Normal small bowel appearance. Mild diverticulosis of the left colon but without evidence of diverticulitis. Vascular/Lymphatic: Aortic atherosclerosis. No aneurysm. IVC is normal. No adenopathy. Reproductive: No pelvic mass.  Small calcified leiomyomas. Other: No free fluid or air. Musculoskeletal: Normal IMPRESSION: 1. No acute finding. No abnormality seen to explain the clinical presentation. 2. Small hiatal hernia. Mild diverticulosis of the left colon but without evidence of diverticulitis. 3. Aortic atherosclerosis. 4. Small calcified leiomyomas of the uterus. Aortic Atherosclerosis (ICD10-I70.0). Electronically Signed   By: Paulina Fusi M.D.   On: 01/26/2023  21:35   DG Knee Complete 4 Views Right  Result Date: 01/26/2023 CLINICAL DATA:  Recent fall with right knee pain, initial encounter EXAM: RIGHT KNEE - COMPLETE 4+ VIEW COMPARISON:  None Available. FINDINGS: No evidence of fracture, dislocation, or joint effusion. No evidence of arthropathy or other focal bone abnormality. Soft tissues are unremarkable. IMPRESSION: No acute abnormality noted. Electronically Signed   By: Alcide Clever M.D.   On: 01/26/2023 19:27    Pending Labs Unresulted Labs (From admission, onward)     Start     Ordered   01/27/23 0500  CBC  Tomorrow morning,   R        01/26/23 2221   01/27/23 0500  Basic metabolic panel  Tomorrow morning,   R        01/26/23 2221   01/26/23 2228  Magnesium  ONCE - STAT,   STAT        01/26/23 2227   01/26/23 2219  Beta-hydroxybutyric acid  ONCE - URGENT,   URGENT        01/26/23 2218   01/26/23 2219  TSH  Once,   URGENT        01/26/23 2218   01/26/23 2218  Lactic acid, plasma  (Lactic Acid)  ONCE - STAT,   STAT        01/26/23 2218   01/26/23 1746  Urinalysis, Routine w reflex microscopic -Urine, Clean Catch  Once,   URGENT       Question:  Specimen Source  Answer:  Urine, Clean Catch   01/26/23 1746            Vitals/Pain Today's Vitals   01/26/23 1739 01/26/23 1742 01/26/23 1852 01/26/23 2045  BP:   109/82 114/84  Pulse:   95 90  Resp:   20 14  Temp:    98.4 F (36.9 C)  TempSrc:    Oral  SpO2:   100% 100%  Weight:  60.8 kg    Height:  5\' 6"  (1.676 m)    PainSc: 5    0-No pain    Isolation Precautions No active isolations  Medications Medications  naphazoline-glycerin (CLEAR EYES REDNESS) ophth solution 1 drop (has no administration in time range)  lactated ringers infusion ( Intravenous New Bag/Given 01/26/23 2236)  enoxaparin (LOVENOX) injection 30 mg (has no administration in time range)  acetaminophen (TYLENOL) tablet 650 mg (has no administration in time range)    Or  acetaminophen (TYLENOL)  suppository 650 mg (has no administration in time range)  ondansetron (ZOFRAN) tablet 4 mg (has no administration in time range)    Or  ondansetron (ZOFRAN) injection 4 mg (has no administration in time range)  sodium chloride 0.9 % bolus 1,000 mL (0 mLs Intravenous Stopped 01/26/23 2019)  potassium chloride 10 mEq in 100 mL IVPB (0 mEq Intravenous Stopped 01/26/23 2345)  ondansetron (ZOFRAN) injection 4 mg (4 mg Intravenous Given 01/26/23 2042)  famotidine (PEPCID) IVPB 20 mg premix (0 mg Intravenous Stopped 01/26/23 2123)    Mobility walks with device   Pt states she started using a walker yesterday.  Focused Assessments Cardiac Assessment Handoff:  Cardiac Rhythm: Normal sinus rhythm No results found for: "CKTOTAL", "CKMB", "CKMBINDEX", "TROPONINI" Lab Results  Component Value Date   DDIMER 2.14 (H) 12/14/2021   Does the Patient currently have chest pain? No   , Neuro Assessment Handoff:  Swallow screen pass? Yes  Cardiac Rhythm: Normal sinus rhythm       Neuro Assessment:  WDL Neuro Checks:      Has TPA been given? No If patient is a Neuro Trauma and patient is going to OR before floor call report to 4N Charge nurse: [phone] or [phone]   R Recommendations: See Admitting Provider Note  Report given to:   Additional Notes:

## 2023-01-26 NOTE — Assessment & Plan Note (Addendum)
Sounds like pt might have been drinking fairly heavily back around time of Feb admit based on notes and LFT elevations then; however pt tells me she hasn't had any EtOH to drink in past few months, last drink was in July she states.  (Although EDP told me that patient told her that she WAS still drinking). LFTs look okay so dont think EtOH hepatitis.  Lipase nl and pancreas not abnormal on CT to suggest pancreatitis. Just monitor for S/Sx of withdrawal for the moment.

## 2023-01-26 NOTE — Assessment & Plan Note (Signed)
Admitting AG 18, BICARB 25 Lactic acid normal BHB mildly elevated at 1.62. don't think this is DKA but rather alcoholic ketoacidosis. Continue with IVF.

## 2023-01-26 NOTE — ED Notes (Signed)
Pressures remain low and pt remains tachy.  PA to assess pt.  IV started.

## 2023-01-26 NOTE — H&P (Signed)
History and Physical    Patient: Stacey Oconnor ZOX:[id number] DOB: Sep 04, 1955 DOA: 01/26/2023 DOS: the patient was seen and examined on 01/26/2023 PCP: Fleet Contras, MD  Patient coming from: Home  Chief Complaint:  Chief Complaint  Patient presents with   Emesis   Fall   HPI: Stacey Oconnor is a 67 y.o. female with medical history significant of ongoing EtOH use/abuse.  Pt admitted in Feb for UTI, AKF, and electrolyte imbalances.  Pt in to ED today with c/o generalized weakness, N/V/D, frequent falls, poor appetitive.  Admits she's still drinking EtOH.  No fever, has chills, no CP nor SOB.  No dysuria.  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 LFTs normal this time around as is lipase.  CT AP is unimpressive.  No SIRS.  AG 18.   Review of Systems: As mentioned in the history of present illness. All other systems reviewed and are negative. Past Medical History:  Diagnosis Date   Ankle fracture    Left   Nausea vomiting and diarrhea 01/26/2023   Past Surgical History:  Procedure Laterality Date   ABSCESS DRAINAGE     Left Buttock   ORIF ANKLE FRACTURE Left 09/26/2018   Procedure: OPEN REDUCTION INTERNAL FIXATION (ORIF) ANKLE FRACTURE;  Surgeon: Cammy Copa, MD;  Location: [facility] OR;  Service: Orthopedics;  Laterality: Left;   Social History:  reports that she has never smoked. She has never used smokeless tobacco. She reports that she does not currently use alcohol. She reports that she does not use drugs.  No Known Allergies  History reviewed. No pertinent family history.  Prior to Admission medications   Medication Sig Start Date End Date Taking? Authorizing Provider  Multiple Vitamin (MULTIVITAMIN WITH MINERALS) TABS tablet Take 1 tablet by mouth daily.   Yes [provider]  polyethylene glycol (MIRALAX / GLYCOLAX) 17 g packet Take 17 g by mouth daily as needed for mild constipation. 06/15/22  Yes Berton Mount I, MD   tetrahydrozoline 0.05 % ophthalmic solution Place 1 drop into both eyes daily as needed (dry eyes).   Yes [provider]  Thiamine HCl (VITAMIN B-1 PO) Take 1 tablet by mouth daily.   Yes [provider]  VITAMIN A PO Take 1 tablet by mouth daily.   Yes [provider]  Vitamin D, Ergocalciferol, (DRISDOL) 1.25 MG (50000 UNIT) CAPS capsule Take 50,000 Units by mouth once a week. 03/29/22  Yes [provider]    Physical Exam: Vitals:   01/26/23 1736 01/26/23 1742 01/26/23 1852 01/26/23 2045  BP: 94/81  109/82 114/84  Pulse: (!) 121  95 90  Resp: 18  20 14   Temp: 97.9 F (36.6 C)   98.4 F (36.9 C)  TempSrc:    Oral  SpO2: 100%  100% 100%  Weight:  60.8 kg    Height:  5\' 6"  (1.676 m)     Constitutional: NAD, calm, comfortable Respiratory: clear to auscultation bilaterally, no wheezing, no crackles. Normal respiratory effort. No accessory muscle use.  Cardiovascular: Regular rate and rhythm, no murmurs / rubs / gallops. No extremity edema. 2+ pedal pulses. No carotid bruits.  Abdomen: no tenderness, no masses palpated. No hepatosplenomegaly. Bowel sounds positive.  Neurologic: CN 2-12 grossly intact. Sensation intact, DTR normal. Strength 5/5 in all 4.  Psychiatric: Normal judgment and insight. Alert and oriented x 3. Normal mood.   Data Reviewed:    Labs on Admission: I have personally reviewed following  labs and imaging studies  CBC: Recent Labs  Lab 01/26/23 1830  WBC 6.6  HGB 12.3  HCT 35.3*  MCV 105.7*  PLT 414*   Basic Metabolic Panel: Recent Labs  Lab 01/26/23 1830  NA 136  K 2.7*  CL 93*  CO2 25  GLUCOSE 131*  BUN 42*  CREATININE 3.33*  CALCIUM 9.3   GFR: Estimated Creatinine Clearance: 15.8 mL/min (A) (by C-G formula based on SCr of 3.33 mg/dL (H)). Liver Function Tests: Recent Labs  Lab 01/26/23 1830  AST 39  ALT 19  ALKPHOS 126  BILITOT 1.0  PROT 7.8  ALBUMIN 3.2*   Recent Labs  Lab 01/26/23 1830   LIPASE 30   No results for input(s): "AMMONIA" in the last 168 hours. Coagulation Profile: No results for input(s): "INR", "PROTIME" in the last 168 hours. Cardiac Enzymes: No results for input(s): "CKTOTAL", "CKMB", "CKMBINDEX", "TROPONINI" in the last 168 hours. BNP (last 3 results) No results for input(s): "PROBNP" in the last 8760 hours. HbA1C: No results for input(s): "HGBA1C" in the last 72 hours. CBG: No results for input(s): "GLUCAP" in the last 168 hours. Lipid Profile: No results for input(s): "CHOL", "HDL", "LDLCALC", "TRIG", "CHOLHDL", "LDLDIRECT" in the last 72 hours. Thyroid Function Tests: No results for input(s): "TSH", "T4TOTAL", "FREET4", "T3FREE", "THYROIDAB" in the last 72 hours. Anemia Panel: No results for input(s): "VITAMINB12", "FOLATE", "FERRITIN", "TIBC", "IRON", "RETICCTPCT" in the last 72 hours. Urine analysis:    Component Value Date/Time   COLORURINE AMBER (A) 06/30/2022 1325   APPEARANCEUR CLOUDY (A) 06/30/2022 1325   LABSPEC 1.027 06/30/2022 1325   PHURINE 5.0 06/30/2022 1325   GLUCOSEU 50 (A) 06/30/2022 1325   HGBUR NEGATIVE 06/30/2022 1325   BILIRUBINUR MODERATE (A) 06/30/2022 1325   KETONESUR 20 (A) 06/30/2022 1325   PROTEINUR 100 (A) 06/30/2022 1325   NITRITE NEGATIVE 06/30/2022 1325   LEUKOCYTESUR LARGE (A) 06/30/2022 1325    Radiological Exams on Admission: CT ABDOMEN PELVIS WO CONTRAST  Result Date: 01/26/2023 CLINICAL DATA:  Abdominal pain, acute, nonlocalized. Nausea vomiting and diarrhea. Acute kidney injury. EXAM: CT ABDOMEN AND PELVIS WITHOUT CONTRAST TECHNIQUE: Multidetector CT imaging of the abdomen and pelvis was performed following the standard protocol without IV contrast. RADIATION DOSE REDUCTION: This exam was performed according to the departmental dose-optimization program which includes automated exposure control, adjustment of the mA and/or kV according to patient size and/or use of iterative reconstruction technique.  COMPARISON:  06/13/2022 FINDINGS: Lower chest: Lung bases are clear. Hepatobiliary: Liver parenchyma appears normal without contrast. No calcified gallstones. Previously seen steatosis has largely resolved. Pancreas: Normal Spleen: Normal Adrenals/Urinary Tract: Adrenal glands are normal. Kidneys are normal by CT. Normal size. No mass, stone or hydronephrosis. Bladder is normal. Stomach/Bowel: Small hiatal hernia. Stomach otherwise normal. Normal small bowel appearance. Mild diverticulosis of the left colon but without evidence of diverticulitis. Vascular/Lymphatic: Aortic atherosclerosis. No aneurysm. IVC is normal. No adenopathy. Reproductive: No pelvic mass.  Small calcified leiomyomas. Other: No free fluid or air. Musculoskeletal: Normal IMPRESSION: 1. No acute finding. No abnormality seen to explain the clinical presentation. 2. Small hiatal hernia. Mild diverticulosis of the left colon but without evidence of diverticulitis. 3. Aortic atherosclerosis. 4. Small calcified leiomyomas of the uterus. Aortic Atherosclerosis (ICD10-I70.0). Electronically Signed   By: Paulina Fusi M.D.   On: 01/26/2023 21:35   DG Knee Complete 4 Views Right  Result Date: 01/26/2023 CLINICAL DATA:  Recent fall with right knee pain, initial encounter EXAM: RIGHT KNEE - COMPLETE  4+ VIEW COMPARISON:  None Available. FINDINGS: No evidence of fracture, dislocation, or joint effusion. No evidence of arthropathy or other focal bone abnormality. Soft tissues are unremarkable. IMPRESSION: No acute abnormality noted. Electronically Signed   By: Alcide Clever M.D.   On: 01/26/2023 19:27    EKG: Independently reviewed.   Assessment and Plan: * AKI (acute kidney injury) (HCC) Not a pre-renal pattern, but ? Pre-renal / ATN none the less given soft BPs in ED + report of N/V/D. IVF Strict intake and output No obstruction demonstrated on CT AP Repeat BMP in AM UA pending Replace K  High anion gap metabolic acidosis AG 18, BICARB  25 Check lactate Check BHB  Nausea vomiting and diarrhea Zofran PRN CT AP without acute findings to explain.  History of ETOH abuse Sounds like pt might have been drinking fairly heavily back around time of Feb admit based on notes and LFT elevations then; however pt tells me she hasn't had any EtOH to drink in past few months, last drink was in July she states.  (Although EDP told me that patient told her that she WAS still drinking). LFTs look okay so dont think EtOH hepatitis.  Lipase nl and pancreas not abnormal on CT to suggest pancreatitis. Just monitor for S/Sx of withdrawal for the moment.      Advance Care Planning:   Code Status: Full Code  Consults: None, call nephro in AM if kidneys aren't showing signs of prompt improvement / recovery  Family Communication: No family in room  Severity of Illness: The appropriate patient status for this patient is OBSERVATION. Observation status is judged to be reasonable and necessary in order to provide the required intensity of service to ensure the patient's safety. The patient's presenting symptoms, physical exam findings, and initial radiographic and laboratory data in the context of their medical condition is felt to place them at decreased risk for further clinical deterioration. Furthermore, it is anticipated that the patient will be medically stable for discharge from the hospital within 2 midnights of admission.   Author: Hillary Bow., DO 01/26/2023 10:30 PM  For on call review [email or web address].

## 2023-01-26 NOTE — Assessment & Plan Note (Signed)
Zofran PRN CT AP without acute findings to explain.

## 2023-01-26 NOTE — Assessment & Plan Note (Addendum)
Not a pre-renal pattern, but ? Pre-renal / ATN none the less given soft BPs in ED + report of N/V/D. IVF Strict intake and output No obstruction demonstrated on CT AP Repeat BMP in AM UA pending Replace K

## 2023-01-27 ENCOUNTER — Encounter (HOSPITAL_COMMUNITY): Payer: Self-pay | Admitting: Internal Medicine

## 2023-01-27 DIAGNOSIS — W19XXXA Unspecified fall, initial encounter: Secondary | ICD-10-CM | POA: Diagnosis present

## 2023-01-27 DIAGNOSIS — E860 Dehydration: Secondary | ICD-10-CM | POA: Diagnosis present

## 2023-01-27 DIAGNOSIS — E876 Hypokalemia: Secondary | ICD-10-CM | POA: Diagnosis present

## 2023-01-27 DIAGNOSIS — R296 Repeated falls: Secondary | ICD-10-CM | POA: Diagnosis present

## 2023-01-27 DIAGNOSIS — N179 Acute kidney failure, unspecified: Secondary | ICD-10-CM | POA: Diagnosis present

## 2023-01-27 DIAGNOSIS — K529 Noninfective gastroenteritis and colitis, unspecified: Secondary | ICD-10-CM | POA: Diagnosis present

## 2023-01-27 DIAGNOSIS — F1011 Alcohol abuse, in remission: Secondary | ICD-10-CM | POA: Diagnosis not present

## 2023-01-27 DIAGNOSIS — F1010 Alcohol abuse, uncomplicated: Secondary | ICD-10-CM | POA: Diagnosis present

## 2023-01-27 DIAGNOSIS — E8729 Other acidosis: Secondary | ICD-10-CM | POA: Diagnosis present

## 2023-01-27 DIAGNOSIS — Z79899 Other long term (current) drug therapy: Secondary | ICD-10-CM | POA: Diagnosis not present

## 2023-01-27 DIAGNOSIS — R112 Nausea with vomiting, unspecified: Secondary | ICD-10-CM | POA: Diagnosis not present

## 2023-01-27 LAB — CBC
HCT: 29 % — ABNORMAL LOW (ref 36.0–46.0)
Hemoglobin: 10.1 g/dL — ABNORMAL LOW (ref 12.0–15.0)
MCH: 37 pg — ABNORMAL HIGH (ref 26.0–34.0)
MCHC: 34.8 g/dL (ref 30.0–36.0)
MCV: 106.2 fL — ABNORMAL HIGH (ref 80.0–100.0)
Platelets: 321 10*3/uL (ref 150–400)
RBC: 2.73 MIL/uL — ABNORMAL LOW (ref 3.87–5.11)
RDW: 15 % (ref 11.5–15.5)
WBC: 6.2 10*3/uL (ref 4.0–10.5)
nRBC: 0 % (ref 0.0–0.2)

## 2023-01-27 LAB — BASIC METABOLIC PANEL
Anion gap: 13 (ref 5–15)
BUN: 39 mg/dL — ABNORMAL HIGH (ref 8–23)
CO2: 24 mmol/L (ref 22–32)
Calcium: 8.2 mg/dL — ABNORMAL LOW (ref 8.9–10.3)
Chloride: 96 mmol/L — ABNORMAL LOW (ref 98–111)
Creatinine, Ser: 2.94 mg/dL — ABNORMAL HIGH (ref 0.44–1.00)
GFR, Estimated: 17 mL/min — ABNORMAL LOW (ref >60)
Glucose, Bld: 134 mg/dL — ABNORMAL HIGH (ref 70–99)
Potassium: 2.6 mmol/L — CL (ref 3.5–5.1)
Sodium: 133 mmol/L — ABNORMAL LOW (ref 135–145)

## 2023-01-27 LAB — LACTIC ACID, PLASMA: Lactic Acid, Venous: 1.7 mmol/L (ref 0.5–1.9)

## 2023-01-27 LAB — BETA-HYDROXYBUTYRIC ACID: Beta-Hydroxybutyric Acid: 1.62 mmol/L — ABNORMAL HIGH (ref 0.05–0.27)

## 2023-01-27 LAB — TSH: TSH: 0.675 u[IU]/mL (ref 0.350–4.500)

## 2023-01-27 LAB — MAGNESIUM: Magnesium: 1.9 mg/dL (ref 1.7–2.4)

## 2023-01-27 MED ORDER — MAGNESIUM SULFATE 2 GM/50ML IV SOLN
2.0000 g | Freq: Once | INTRAVENOUS | Status: AC
  Administered 2023-01-27: 2 g via INTRAVENOUS
  Filled 2023-01-27: qty 50

## 2023-01-27 MED ORDER — POTASSIUM CHLORIDE 10 MEQ/100ML IV SOLN
10.0000 meq | INTRAVENOUS | Status: AC
  Administered 2023-01-27 (×3): 10 meq via INTRAVENOUS
  Filled 2023-01-27 (×3): qty 100

## 2023-01-27 MED ORDER — POTASSIUM CHLORIDE CRYS ER 20 MEQ PO TBCR
40.0000 meq | EXTENDED_RELEASE_TABLET | ORAL | Status: AC
  Administered 2023-01-27 (×2): 40 meq via ORAL
  Filled 2023-01-27 (×2): qty 2

## 2023-01-27 MED ORDER — POTASSIUM CHLORIDE 10 MEQ/100ML IV SOLN
10.0000 meq | Freq: Once | INTRAVENOUS | Status: AC
  Administered 2023-01-27: 10 meq via INTRAVENOUS
  Filled 2023-01-27: qty 100

## 2023-01-27 NOTE — Hospital Course (Signed)
HPI: Stacey Oconnor is a 67 y.o. female with medical history significant of ongoing EtOH use/abuse.   Pt admitted in Feb for UTI, AKF, and electrolyte imbalances.   Pt in to ED today with c/o generalized weakness, N/V/D, frequent falls, poor appetitive.  Admits she's still drinking EtOH.  No fever, has chills, no CP nor SOB.  No dysuria.   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 LFTs normal this time around as is lipase.  CT AP is unimpressive.  No SIRS.  AG 18.  Significant Events: Admitted 01/26/2023 for AKI 01-27-2023 pt denies EOTH use, despite admitting to drinking ETOH to admitting physician.  Significant Labs: Admitting K 2.7, BUN 42, Scr 3.33 01-27-2023 K 2.6, Mg 1.9, BUN 39, Scr 2.94 01-28-2023 K 4.8, Mg 2.3, BUN 23, Scr 1.61  Significant Imaging Studies: Admitting CT abd shows  No acute finding. No abnormality seen to explain the clinical presentation. 2. Small hiatal hernia. Mild diverticulosis of the left colon but without evidence of diverticulitis. 3. Aortic  atherosclerosis. 4. Small calcified leiomyomas of the uterus  Antibiotic Therapy: Anti-infectives (From admission, onward)    None       Procedures:   Consultants:

## 2023-01-27 NOTE — Evaluation (Addendum)
Physical Therapy Evaluation Patient Details Name: Stacey Oconnor MRN: [id number] DOB: February 15, 1956 Today's Date: 01/27/2023  History of Present Illness  Pt is 67 yo female who presents on 01/26/23 with weakness, N/V/D, falls, poor appetite. Pt in acute kidney failure. PMH: EtOH abuse  Clinical Impression  Pt from home alone, reports she has fallen multiple times. Recently hurt R knee, point tender lateral joint line, no buckling with mobility though. Pt denies EtOH since early summer. Pt mobilizing independently, feels safer with use of RW. Ambulated 46' with supervision. Has been borrowing a rollator from a friend, has not had it with her when she has fallen. Recommend one for her for home. Pt HR 114 bpm with initial standing, 96 bpm after ambulation. SPO2 remained in 90's on RW. Pt fatigues quickly with activity. Will follow acutely and reviewed HEP. Recommend outpt PT for balance at d/c. PT will continue to follow.          If plan is discharge home, recommend the following: Assistance with cooking/housework   Can travel by Nurse, mental health (4 wheels)  Recommendations for Other Services       Functional Status Assessment Patient has had a recent decline in their functional status and demonstrates the ability to make significant improvements in function in a reasonable and predictable amount of time.     Precautions / Restrictions Precautions Precautions: Fall Precaution Comments: has fallen numerous times recently, hurt R knee 2 falls back Restrictions Weight Bearing Restrictions: No      Mobility  Bed Mobility Overal bed mobility: Modified Independent             General bed mobility comments: no assist needed    Transfers Overall transfer level: Needs assistance Equipment used: Rolling walker (2 wheels) Transfers: Sit to/from Stand Sit to Stand: Supervision           General transfer comment: supervision for safety,  vc's for hand placement    Ambulation/Gait Ambulation/Gait assistance: Supervision Gait Distance (Feet): 80 Feet Assistive device: Rolling walker (2 wheels) Gait Pattern/deviations: Step-through pattern, Antalgic Gait velocity: decreased Gait velocity interpretation: <1.8 ft/sec, indicate of risk for recurrent falls   General Gait Details: noted RLE antalgia, pt reports feeling safer with RW. No overt LOB with use of RW  Stairs            Wheelchair Mobility     Tilt Bed    Modified Rankin (Stroke Patients Only)       Balance Overall balance assessment: History of Falls, Needs assistance Sitting-balance support: No upper extremity supported, Feet supported Sitting balance-Leahy Scale: Good     Standing balance support: No upper extremity supported Standing balance-Leahy Scale: Fair Standing balance comment: able to close eyes and maintain balance without increased sway. Does not reach for stable surfaces when standing or stepping without UE support.                             Pertinent Vitals/Pain Pain Assessment Pain Assessment: Faces Faces Pain Scale: Hurts little more Pain Location: lateral knee tenderness Pain Descriptors / Indicators: Sore Pain Intervention(s): Limited activity within patient's tolerance, Monitored during session    Home Living Family/patient expects to be discharged to:: Private residence Living Arrangements: Alone Available Help at Discharge: Family;Available PRN/intermittently Type of Home: House Home Access: Stairs to enter Entrance Stairs-Rails: Left Entrance Stairs-Number of Steps: 6   Home Layout:  One level Home Equipment: Environmental education officer Comments: drives, retired from Sports administrator    Prior Function Prior Level of Function : Independent/Modified Independent;Driving             Mobility Comments: has borrowed a Occupational hygienist from a friend. Gets fatigued very quickly and needs to sit down ADLs  Comments: independent     Extremity/Trunk Assessment   Upper Extremity Assessment Upper Extremity Assessment: Overall WFL for tasks assessed    Lower Extremity Assessment Lower Extremity Assessment: Defer to PT evaluation RLE Deficits / Details: hip flex 3+/5, knee ext 4/5, limited due to lateral knee pain RLE Sensation: WNL RLE Coordination: WNL    Cervical / Trunk Assessment Cervical / Trunk Assessment: Normal  Communication   Communication Communication: No apparent difficulties Cueing Techniques: Verbal cues  Cognition Arousal: Alert Behavior During Therapy: Flat affect Overall Cognitive Status: Within Functional Limits for tasks assessed                                 General Comments: offers minimal info. Reports she has not been drinking since late June but ED physician reported that she said she was still drinking.        General Comments General comments (skin integrity, edema, etc.): discussed HEP for R knee pain including quad sets, LAQ with 5 sec hold, and mini squats    Exercises     Assessment/Plan    PT Assessment Patient needs continued PT services  PT Problem List Decreased strength;Decreased activity tolerance;Decreased mobility;Decreased balance;Pain       PT Treatment Interventions DME instruction;Gait training;Stair training;Functional mobility training;Therapeutic activities;Therapeutic exercise;Balance training;Patient/family education    PT Goals (Current goals can be found in the Care Plan section)  Acute Rehab PT Goals Patient Stated Goal: return home PT Goal Formulation: With patient Time For Goal Achievement: 02/10/23 Potential to Achieve Goals: Good    Frequency Min 1X/week     Co-evaluation               AM-PAC PT "6 Clicks" Mobility  Outcome Measure Help needed turning from your back to your side while in a flat bed without using bedrails?: None Help needed moving from lying on your back to sitting on  the side of a flat bed without using bedrails?: None Help needed moving to and from a bed to a chair (including a wheelchair)?: None Help needed standing up from a chair using your arms (e.g., wheelchair or bedside chair)?: None Help needed to walk in hospital room?: A Little Help needed climbing 3-5 steps with a railing? : A Little 6 Click Score: 22    End of Session Equipment Utilized During Treatment: Gait belt Activity Tolerance: Patient tolerated treatment well Patient left: in bed;with call bell/phone within reach Nurse Communication: Mobility status PT Visit Diagnosis: Unsteadiness on feet (R26.81);Pain Pain - Right/Left: Right Pain - part of body: Knee    Time: 1035-1110 PT Time Calculation (min) (ACUTE ONLY): 35 min   Charges:   PT Evaluation $PT Eval Moderate Complexity: 1 Mod PT Treatments $Gait Training: 8-22 mins PT General Charges $$ ACUTE PT VISIT: 1 Visit         Lyanne Co, PT  Acute Rehab Services Secure chat preferred Office [phone]   Lawana Chambers Lucielle Vokes 01/27/2023, 2:35 PM

## 2023-01-27 NOTE — TOC CM/SW Note (Addendum)
Transition of Care [facility]) - Inpatient Brief Assessment   Patient Details  Name: Anitta Tenny MRN: [id number] Date of Birth: [birth year]  Transition of [facility]) CM/SW Contact:    Tom-Johnson, Hershal Coria, RN Phone Number: 01/27/2023, 4:51 PM   Clinical Narrative:  Patient presented to the ED with N/V, Diarrhea, and Generalized Weakness with Falls at home. Admitted with AKI. Has hx of ETOH, states she has not had Alcohol in months.    Patient is from home alone, does not have children. Has five supportive siblings.  Independent with care and drive self prior o admission. Has a shower seat at home. PCP is Fleet Contras, MD and uses [facility] on [address].   Outpatient PT/OT recommended, patient chose [facility] on [facility], referral and order on AVS.  Patient not Medically ready for discharge.  CM will continue to follow as patient progresses with care towards discharge.        Transition of Care Asessment: Insurance and Status: Insurance coverage has been reviewed Patient has primary care physician: Yes Home environment has been reviewed: Yes Prior level of function:: Independent Prior/Current Home Services: No current home services Social Determinants of Health Reivew: SDOH reviewed no interventions necessary Readmission risk has been reviewed: Yes Transition of care needs: transition of care needs identified, TOC will continue to follow

## 2023-01-27 NOTE — Subjective & Objective (Signed)
Serum creatinine 1.61 today. Not back to her baseline of 1.0. hypokalemia has resolved.  Still not hungry. Able to drink water and coffee.  Using walker. Walked 80 feet with PT.

## 2023-01-27 NOTE — Evaluation (Signed)
Occupational Therapy Evaluation Patient Details Name: Stacey Oconnor MRN: [id number] DOB: June 28, 1955 Today's Date: 01/27/2023   History of Present Illness Pt is 67 yo female who presents on 01/26/23 with weakness, N/V/D, falls, poor appetite. Pt in acute kidney failure. PMH: EtOH abuse   Clinical Impression   PTA pt lives alone independently and has a neighbor who cn assist with IADL tasks if needed. Currently requires CGA with mobility and ADL tasks. Acute OT will follow for educaiton on home safety and reducing risk of falls to facilitate safe DC home. Pt does not want HH services however is willing to go to outpt therapy to work on her balance. Discussed with CM. Pt would benefit from information on a Fall Alert system. Acute OT will follow.       If plan is discharge home, recommend the following:      Functional Status Assessment  Patient has had a recent decline in their functional status and demonstrates the ability to make significant improvements in function in a reasonable and predictable amount of time.  Equipment Recommendations  Other (comment) (rollator)    Recommendations for Other Services       Precautions / Restrictions Precautions Precautions: Fall Precaution Comments: has fallen numerous times recently, hurt R knee 2 falls back Restrictions Weight Bearing Restrictions: No      Mobility Bed Mobility Overal bed mobility: Modified Independent             General bed mobility comments: no assist needed    Transfers Overall transfer level: Needs assistance Equipment used: Rolling walker (2 wheels) Transfers: Sit to/from Stand Sit to Stand: Supervision           General transfer comment: supervision for safety, vc's for hand placement      Balance Overall balance assessment: History of Falls, Needs assistance Sitting-balance support: No upper extremity supported, Feet supported Sitting balance-Leahy Scale: Good     Standing balance support:  No upper extremity supported Standing balance-Leahy Scale: Fair Standing balance comment: able to close eyes and maintain balance without increased sway. Does not reach for stable surfaces when standing or stepping without UE support.                           ADL either performed or assessed with clinical judgement   ADL Overall ADL's : Needs assistance/impaired Eating/Feeding: Independent   Grooming: Modified independent   Upper Body Bathing: Set up;Sitting   Lower Body Bathing: Supervison/ safety;Sit to/from stand   Upper Body Dressing : Set up   Lower Body Dressing: Supervision/safety;Sit to/from stand   Toilet Transfer: Contact guard assist   Toileting- Clothing Manipulation and Hygiene: Supervision/safety       Functional mobility during ADLs: Contact guard assist;Rolling walker (2 wheels)       Vision Baseline Vision/History: 0 No visual deficits;1 Wears glasses       Perception         Praxis         Pertinent Vitals/Pain Pain Assessment Pain Assessment: No/denies pain     Extremity/Trunk Assessment Upper Extremity Assessment Upper Extremity Assessment: Overall WFL for tasks assessed   Lower Extremity Assessment Lower Extremity Assessment: Defer to PT evaluation RLE Deficits / Details: hip flex 3+/5, knee ext 4/5, limited due to lateral knee pain RLE Sensation: WNL RLE Coordination: WNL   Cervical / Trunk Assessment Cervical / Trunk Assessment: Normal   Communication Communication Communication: No apparent difficulties Cueing Techniques: Verbal  cues   Cognition Arousal: Alert Behavior During Therapy: Flat affect Overall Cognitive Status: Within Functional Limits for tasks assessed                                 General Comments: slow in responses; Recieved a score of 2 which places her WNL on the Short Blessed TEst of attnetion and concentration     General Comments  discussed HEP for R knee pain including quad  sets, LAQ with 5 sec hold, and mini squats    Exercises     Shoulder Instructions      Home Living Family/patient expects to be discharged to:: Private residence Living Arrangements: Alone Available Help at Discharge: Family;Available PRN/intermittently Type of Home: House Home Access: Stairs to enter [employer] of Steps: 6 Entrance Stairs-Rails: Left Home Layout: One level     Bathroom Shower/Tub: Chief Strategy Officer: Standard Bathroom Accessibility: Yes How Accessible: Accessible via walker Home Equipment: Shower seat   Additional Comments: drives, retired from Sports administrator      Prior Functioning/Environment Prior Level of Function : Independent/Modified Independent;Driving             Mobility Comments: has borrowed a Occupational hygienist from a friend. Gets fatigued very quickly and needs to sit down ADLs Comments: independent        OT Problem List: Decreased strength;Decreased activity tolerance;Impaired balance (sitting and/or standing);Decreased safety awareness;Decreased knowledge of use of DME or AE      OT Treatment/Interventions: Self-care/ADL training;Therapeutic exercise;Energy conservation;DME and/or AE instruction;Therapeutic activities;Patient/family education;Balance training    OT Goals(Current goals can be found in the care plan section) Acute Rehab OT Goals Patient Stated Goal: home OT Goal Formulation: With patient Time For Goal Achievement: 02/10/23 Potential to Achieve Goals: Good  OT Frequency: Min 1X/week    Co-evaluation              AM-PAC OT "6 Clicks" Daily Activity     Outcome Measure Help from another person eating meals?: None Help from another person taking care of personal grooming?: None Help from another person toileting, which includes using toliet, bedpan, or urinal?: A Little Help from another person bathing (including washing, rinsing, drying)?: A Little Help from another person to put on  and taking off regular upper body clothing?: A Little Help from another person to put on and taking off regular lower body clothing?: A Little 6 Click Score: 20   End of Session Equipment Utilized During Treatment: Gait belt;Rolling walker (2 wheels) Nurse Communication: Mobility status  Activity Tolerance: Patient tolerated treatment well Patient left: in bed;with call bell/phone within reach  OT Visit Diagnosis: Unsteadiness on feet (R26.81);Repeated falls (R29.6)                Time: 1345-1410 OT Time Calculation (min): 25 min Charges:  OT General Charges $OT Visit: 1 Visit OT Evaluation $OT Eval Low Complexity: 1 Low OT Treatments $Self Care/Home Management : 8-22 mins  Luisa Dago, OT/L   Acute OT Clinical Specialist Acute Rehabilitation Services Pager [phone] Office [phone]   [facility] 01/27/2023, 2:18 PM

## 2023-01-27 NOTE — Progress Notes (Signed)
PROGRESS NOTE    Stacey Oconnor  WNI:[id number] DOB: February 25, 1956 DOA: 01/26/2023 PCP: Fleet Contras, MD  Subjective: Patient seen and examined.  Patient states that she is still not hungry.  She denies that she is drinking alcohol.  States that she has not had anything alcoholic to drink since May 2024.  Serum creatinine somewhat improved since yesterday.  Remains hypokalemic.   Hospital Course: HPI: Stacey Oconnor is a 67 y.o. female with medical history significant of ongoing EtOH use/abuse.   Pt admitted in Feb for UTI, AKF, and electrolyte imbalances.   Pt in to ED today with c/o generalized weakness, N/V/D, frequent falls, poor appetitive.  Admits she's still drinking EtOH.  No fever, has chills, no CP nor SOB.  No dysuria.   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 LFTs normal this time around as is lipase.  CT AP is unimpressive.  No SIRS.  AG 18.  Significant Events: Admitted 01/26/2023 for AKI 01-27-2023 pt denies EOTH use, despite admitting to drinking ETOH to admitting physician.  Significant Labs: Admitting K 2.7, BUN 42, Scr 3.33 01-27-2023 K 2.6, Mg 1.9, BUN 39, Scr 2.94  Significant Imaging Studies: Admitting CT abd shows  No acute finding. No abnormality seen to explain the clinical presentation. 2. Small hiatal hernia. Mild diverticulosis of the left colon but without evidence of diverticulitis. 3. Aortic  atherosclerosis. 4. Small calcified leiomyomas of the uterus  Antibiotic Therapy: Anti-infectives (From admission, onward)    None       Procedures:   Consultants:     Assessment and Plan: * AKI (acute kidney injury) (HCC) Admitted on 01-26-2023 for AKI.   Scr has improved with IVF. But baseline Scr is normally < 1.0 Continue with IVF No obstruction demonstrated on CT AP Repeat BMP in AM UA pending Replace K with IV and PO kcl. Replace with IV Mg  High anion gap metabolic acidosis Admitting AG 18, BICARB 25 Lactic  acid normal BHB mildly elevated at 1.62. don't think this is DKA but rather alcoholic ketoacidosis. Continue with IVF.  Nausea vomiting and diarrhea Zofran PRN CT AP without acute findings to explain.  History of ETOH abuse Sounds like pt might have been drinking fairly heavily back around time of Feb admit based on notes and LFT elevations then; however pt tells me she hasn't had any EtOH to drink in past few months, last drink was in July she states.  (Although EDP told me that patient told her that she WAS still drinking). LFTs look okay so dont think EtOH hepatitis.  Lipase nl and pancreas not abnormal on CT to suggest pancreatitis. Just monitor for S/Sx of withdrawal for the moment.       DVT prophylaxis: enoxaparin (LOVENOX) injection 30 mg Start: 01/27/23 1000    Code Status: Full Code Family Communication: no family at bedside Disposition Plan: return home Reason for continuing need for hospitalization: remains profoundly hypokalemic. Continue with IV and PO kcl replacement.  Objective: Vitals:   01/27/23 0040 01/27/23 0310 01/27/23 0455 01/27/23 0808  BP: 111/82 100/64 99/68 103/75  Pulse: 83 100 (!) 102 92  Resp: 18 18 18 16   Temp: 97.6 F (36.4 C) 98 F (36.7 C) 98.6 F (37 C) 98.3 F (36.8 C)  TempSrc:   Oral Oral  SpO2: 100% 100% 100% 100%  Weight:      Height:        Intake/Output Summary (Last 24 hours) at 01/27/2023 1018  Last data filed at 01/27/2023 0900 Gross per 24 hour  Intake 1915.58 ml  Output --  Net 1915.58 ml   Filed Weights   01/26/23 1742  Weight: 60.8 kg    Examination:  Physical Exam Vitals and nursing note reviewed.  Constitutional:      General: She is not in acute distress.    Appearance: She is normal weight. She is not toxic-appearing or diaphoretic.     Comments: Appears chronically ill.  Flat affect.  HENT:     Head: Normocephalic and atraumatic.     Nose: Nose normal.  Eyes:     General: No scleral  icterus. Cardiovascular:     Rate and Rhythm: Normal rate and regular rhythm.     Pulses: Normal pulses.  Pulmonary:     Effort: Pulmonary effort is normal.     Breath sounds: Normal breath sounds.  Abdominal:     General: Bowel sounds are normal. There is no distension.     Tenderness: There is no abdominal tenderness.  Musculoskeletal:     Right lower leg: No edema.     Left lower leg: No edema.  Skin:    General: Skin is warm and dry.     Capillary Refill: Capillary refill takes less than 2 seconds.  Neurological:     General: No focal deficit present.     Mental Status: She is alert and oriented to person, place, and time.  Psychiatric:        Mood and Affect: Affect is flat.     Data Reviewed: I have personally reviewed following labs and imaging studies  CBC: Recent Labs  Lab 01/26/23 1830 01/27/23 0216  WBC 6.6 6.2  HGB 12.3 10.1*  HCT 35.3* 29.0*  MCV 105.7* 106.2*  PLT 414* 321   Basic Metabolic Panel: Recent Labs  Lab 01/26/23 1830 01/27/23 0216  NA 136 133*  K 2.7* 2.6*  CL 93* 96*  CO2 25 24  GLUCOSE 131* 134*  BUN 42* 39*  CREATININE 3.33* 2.94*  CALCIUM 9.3 8.2*  MG  --  1.9   GFR: Estimated Creatinine Clearance: 17.9 mL/min (A) (by C-G formula based on SCr of 2.94 mg/dL (H)). Liver Function Tests: Recent Labs  Lab 01/26/23 1830  AST 39  ALT 19  ALKPHOS 126  BILITOT 1.0  PROT 7.8  ALBUMIN 3.2*   Recent Labs  Lab 01/26/23 1830  LIPASE 30   Thyroid Function Tests: Recent Labs    01/27/23 0216  TSH 0.675   Sepsis Labs: Recent Labs  Lab 01/27/23 0216  LATICACIDVEN 1.7    No results found for this or any previous visit (from the past 240 hour(s)).   Radiology Studies: CT ABDOMEN PELVIS WO CONTRAST  Result Date: 01/26/2023 CLINICAL DATA:  Abdominal pain, acute, nonlocalized. Nausea vomiting and diarrhea. Acute kidney injury. EXAM: CT ABDOMEN AND PELVIS WITHOUT CONTRAST TECHNIQUE: Multidetector CT imaging of the abdomen  and pelvis was performed following the standard protocol without IV contrast. RADIATION DOSE REDUCTION: This exam was performed according to the departmental dose-optimization program which includes automated exposure control, adjustment of the mA and/or kV according to patient size and/or use of iterative reconstruction technique. COMPARISON:  06/13/2022 FINDINGS: Lower chest: Lung bases are clear. Hepatobiliary: Liver parenchyma appears normal without contrast. No calcified gallstones. Previously seen steatosis has largely resolved. Pancreas: Normal Spleen: Normal Adrenals/Urinary Tract: Adrenal glands are normal. Kidneys are normal by CT. Normal size. No mass, stone or hydronephrosis. Bladder is normal. Stomach/Bowel:  Small hiatal hernia. Stomach otherwise normal. Normal small bowel appearance. Mild diverticulosis of the left colon but without evidence of diverticulitis. Vascular/Lymphatic: Aortic atherosclerosis. No aneurysm. IVC is normal. No adenopathy. Reproductive: No pelvic mass.  Small calcified leiomyomas. Other: No free fluid or air. Musculoskeletal: Normal IMPRESSION: 1. No acute finding. No abnormality seen to explain the clinical presentation. 2. Small hiatal hernia. Mild diverticulosis of the left colon but without evidence of diverticulitis. 3. Aortic atherosclerosis. 4. Small calcified leiomyomas of the uterus. Aortic Atherosclerosis (ICD10-I70.0). Electronically Signed   By: Paulina Fusi M.D.   On: 01/26/2023 21:35   DG Knee Complete 4 Views Right  Result Date: 01/26/2023 CLINICAL DATA:  Recent fall with right knee pain, initial encounter EXAM: RIGHT KNEE - COMPLETE 4+ VIEW COMPARISON:  None Available. FINDINGS: No evidence of fracture, dislocation, or joint effusion. No evidence of arthropathy or other focal bone abnormality. Soft tissues are unremarkable. IMPRESSION: No acute abnormality noted. Electronically Signed   By: Alcide Clever M.D.   On: 01/26/2023 19:27    Scheduled Meds:   enoxaparin (LOVENOX) injection  30 mg Subcutaneous Q24H   Continuous Infusions:  lactated ringers 100 mL/hr at 01/26/23 2236   magnesium sulfate bolus IVPB 2 g (01/27/23 0948)   potassium chloride 10 mEq (01/27/23 0949)     LOS: 0 days   Time spent: 35 minutes  Carollee Herter, DO  [facility]  01/27/2023, 10:18 AM

## 2023-01-27 NOTE — Progress Notes (Signed)
New Admission Note:   Arrival Method: Stretcher Mental Orientation: alert x 4 Telemetry: box ( Assessment: Completed Skin: see flowsheet IV: RT wrist with LR running Pain: none Tubes: none Safety Measures: Safety Fall Prevention Plan has been discussed Admission: Completed [facility] Orientation: Patient has been orientated to the room, unit and staff.  Family: none at bedside  Orders have been reviewed and implemented. Will continue to monitor the patient. Call light has been placed within reach and bed alarm has been activated.   Artemio Aly BSN, RN Phone number: [phone]

## 2023-01-28 DIAGNOSIS — F1011 Alcohol abuse, in remission: Secondary | ICD-10-CM | POA: Diagnosis not present

## 2023-01-28 DIAGNOSIS — E8729 Other acidosis: Secondary | ICD-10-CM | POA: Diagnosis not present

## 2023-01-28 DIAGNOSIS — R112 Nausea with vomiting, unspecified: Secondary | ICD-10-CM | POA: Diagnosis not present

## 2023-01-28 DIAGNOSIS — N179 Acute kidney failure, unspecified: Secondary | ICD-10-CM | POA: Diagnosis not present

## 2023-01-28 LAB — COMPREHENSIVE METABOLIC PANEL WITH GFR
ALT: 14 U/L (ref 0–44)
AST: 26 U/L (ref 15–41)
Albumin: 2.1 g/dL — ABNORMAL LOW (ref 3.5–5.0)
Alkaline Phosphatase: 89 U/L (ref 38–126)
Anion gap: 6 (ref 5–15)
BUN: 23 mg/dL (ref 8–23)
CO2: 24 mmol/L (ref 22–32)
Calcium: 7.9 mg/dL — ABNORMAL LOW (ref 8.9–10.3)
Chloride: 103 mmol/L (ref 98–111)
Creatinine, Ser: 1.61 mg/dL — ABNORMAL HIGH (ref 0.44–1.00)
GFR, Estimated: 35 mL/min — ABNORMAL LOW
Glucose, Bld: 100 mg/dL — ABNORMAL HIGH (ref 70–99)
Potassium: 4.8 mmol/L (ref 3.5–5.1)
Sodium: 133 mmol/L — ABNORMAL LOW (ref 135–145)
Total Bilirubin: 0.6 mg/dL (ref 0.3–1.2)
Total Protein: 5.2 g/dL — ABNORMAL LOW (ref 6.5–8.1)

## 2023-01-28 LAB — MAGNESIUM: Magnesium: 2.3 mg/dL (ref 1.7–2.4)

## 2023-01-28 NOTE — Progress Notes (Signed)
Occupational Therapy Treatment Patient Details Name: Stacey Oconnor MRN: [id number] DOB: 03-18-1956 Today's Date: 01/28/2023   History of present illness Pt is 67 yo female who presents on 01/26/23 with weakness, N/V/D, falls, poor appetite. Pt in acute kidney failure. PMH: EtOH abuse   OT comments  Pt progressing toward established OT goals. Pt saturated with urine upon arrival, pt was aware, unsure why she didn't call for assistance. Pt completed LB dressing and hygiene while sitting/standing at sink level, with supervision to contact guard assistance for safety and stability. Pt required contact guard assistance during functional mobility at RW level. Educated pt with provided with fall prevention handout. Will continue to follow acutely and progress appropriately.       If plan is discharge home, recommend the following:  Assistance with cooking/housework   Equipment Recommendations  Other (comment) (rollator)    Recommendations for Other Services      Precautions / Restrictions Precautions Precautions: Fall Precaution Comments: has fallen numerous times recently, hurt R knee 2 falls back Restrictions Weight Bearing Restrictions: No       Mobility Bed Mobility Overal bed mobility: Modified Independent             General bed mobility comments: no assist needed    Transfers Overall transfer level: Needs assistance Equipment used: Rolling walker (2 wheels) Transfers: Sit to/from Stand Sit to Stand: Contact guard assist           General transfer comment: slight instability noted, pt able to self correct     Balance Overall balance assessment: History of Falls, Needs assistance Sitting-balance support: No upper extremity supported, Feet supported Sitting balance-Leahy Scale: Good     Standing balance support: Single extremity supported Standing balance-Leahy Scale: Fair Standing balance comment: utilizing single UE support while standing during hygiene                            ADL either performed or assessed with clinical judgement   ADL Overall ADL's : Needs assistance/impaired Eating/Feeding: Independent   Grooming: Supervision/safety;Standing   Upper Body Bathing: Set up;Sitting   Lower Body Bathing: Supervison/ safety;Sit to/from stand Lower Body Bathing Details (indicate cue type and reason): completed standing at sink level Upper Body Dressing : Set up   Lower Body Dressing: Supervision/safety;Sit to/from stand Lower Body Dressing Details (indicate cue type and reason): donned/doffing briefs Toilet Transfer: Contact guard assist;Rolling walker (2 wheels)   Toileting- Clothing Manipulation and Hygiene: Supervision/safety Toileting - Clothing Manipulation Details (indicate cue type and reason): pt incontinent of urine upon arrival, aware but unsure why she didn't call staff     Functional mobility during ADLs: Contact guard assist;Rolling walker (2 wheels) General ADL Comments: pt demonstrated slight instability with intial stand, was able to self correct    Extremity/Trunk Assessment Upper Extremity Assessment Upper Extremity Assessment: Overall WFL for tasks assessed   Lower Extremity Assessment Lower Extremity Assessment: Defer to PT evaluation RLE Deficits / Details: hip flex 3+/5, knee ext 4/5, limited due to lateral knee pain RLE Sensation: WNL RLE Coordination: WNL        Vision       Perception     Praxis      Cognition Arousal: Alert Behavior During Therapy: Flat affect Overall Cognitive Status: Within Functional Limits for tasks assessed  General Comments: slow in responses, was saturated upon arrival, reports she was aware and unsure why she did not call.        Exercises      Shoulder Instructions       General Comments HR 125bpm with hygiene while standing at sink    Pertinent Vitals/ Pain       Pain Assessment Pain  Assessment: 0-10 Pain Score: 4  Faces Pain Scale: Hurts little more Pain Location: back following pericare at sink Pain Descriptors / Indicators: Sore Pain Intervention(s): Limited activity within patient's tolerance, Monitored during session  Home Living                                          Prior Functioning/Environment              Frequency  Min 1X/week        Progress Toward Goals  OT Goals(current goals can now be found in the care plan section)  Progress towards OT goals: Progressing toward goals  Acute Rehab OT Goals Patient Stated Goal: to go home OT Goal Formulation: With patient Time For Goal Achievement: 02/10/23 Potential to Achieve Goals: Good ADL Goals Pt Will Perform Lower Body Bathing: with modified independence;sit to/from stand Pt Will Perform Lower Body Dressing: with modified independence;sit to/from stand Pt Will Transfer to Toilet: with modified independence;ambulating Additional ADL Goal #1: PT will independently identify 3 strategies to reduce risk of falls Additional ADL Goal #2: Pt will independently identify 3 energy conservation techniques  Plan      Co-evaluation                 AM-PAC OT "6 Clicks" Daily Activity     Outcome Measure   Help from another person eating meals?: None Help from another person taking care of personal grooming?: None Help from another person toileting, which includes using toliet, bedpan, or urinal?: A Little Help from another person bathing (including washing, rinsing, drying)?: A Little Help from another person to put on and taking off regular upper body clothing?: A Little Help from another person to put on and taking off regular lower body clothing?: A Little 6 Click Score: 20    End of Session Equipment Utilized During Treatment: Gait belt;Rolling walker (2 wheels)  OT Visit Diagnosis: Unsteadiness on feet (R26.81);Repeated falls (R29.6)   Activity Tolerance Patient  tolerated treatment well   Patient Left with call bell/phone within reach;with chair alarm set;in chair;with nursing/sitter in room   Nurse Communication Mobility status        Time: 5784-6962 OT Time Calculation (min): 29 min  Charges: OT General Charges $OT Visit: 1 Visit OT Treatments $Self Care/Home Management : 23-37 mins  Rosey Bath OTR/L Acute Rehabilitation Services Office: [phone]   [facility] Crosby 01/28/2023, 10:55 AM

## 2023-01-28 NOTE — Progress Notes (Signed)
PROGRESS NOTE    Stacey Oconnor  ZOX:[id number] DOB: [birth year] DOA: 01/26/2023 PCP: Fleet Contras, MD  Subjective: Serum creatinine 1.61 today. Not back to her baseline of 1.0. hypokalemia has resolved.  Still not hungry. Able to drink water and coffee.  Using walker. Walked 80 feet with PT.   Hospital Course: HPI: Stacey Oconnor is a 67 y.o. female with medical history significant of ongoing EtOH use/abuse.   Pt admitted in Feb for UTI, AKF, and electrolyte imbalances.   Pt in to ED today with c/o generalized weakness, N/V/D, frequent falls, poor appetitive.  Admits she's still drinking EtOH.  No fever, has chills, no CP nor SOB.  No dysuria.   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 LFTs normal this time around as is lipase.  CT AP is unimpressive.  No SIRS.  AG 18.  Significant Events: Admitted 01/26/2023 for AKI 01-27-2023 pt denies EOTH use, despite admitting to drinking ETOH to admitting physician.  Significant Labs: Admitting K 2.7, BUN 42, Scr 3.33 01-27-2023 K 2.6, Mg 1.9, BUN 39, Scr 2.94 01-28-2023 K 4.8, Mg 2.3, BUN 23, Scr 1.61  Significant Imaging Studies: Admitting CT abd shows  No acute finding. No abnormality seen to explain the clinical presentation. 2. Small hiatal hernia. Mild diverticulosis of the left colon but without evidence of diverticulitis. 3. Aortic  atherosclerosis. 4. Small calcified leiomyomas of the uterus  Antibiotic Therapy: Anti-infectives (From admission, onward)    None       Procedures:   Consultants:     Assessment and Plan: * AKI (acute kidney injury) (HCC) Admitted on 01-26-2023 for AKI.   Scr has improved with IVF. But baseline Scr is normally < 1.0 Continue with IVF No obstruction demonstrated on CT AP Repeat BMP in AM Still awaiting UA. Replaced K with IV and PO kcl. Replace with IV Mg  High anion gap metabolic acidosis Admitting AG 18, BICARB 25 Lactic acid normal BHB mildly elevated  at 1.62. don't think this is DKA but rather alcoholic ketoacidosis. Continue with IVF.  Nausea vomiting and diarrhea Zofran PRN CT AP without acute findings to explain.  History of ETOH abuse Sounds like pt might have been drinking fairly heavily back around time of Feb admit based on notes and LFT elevations then; however pt tells me she hasn't had any EtOH to drink in past few months, last drink was in July she states.  (Although EDP told me that patient told her that she WAS still drinking). LFTs look okay so dont think EtOH hepatitis.  Lipase nl and pancreas not abnormal on CT to suggest pancreatitis. Just monitor for S/Sx of withdrawal for the moment.   DVT prophylaxis: enoxaparin (LOVENOX) injection 30 mg Start: 01/27/23 1000    Code Status: Full Code Family Communication: no family at bedside Disposition Plan: return home Reason for continuing need for hospitalization: remains on IVF. Monitoring Scr   Objective: Vitals:   01/27/23 1533 01/27/23 2100 01/28/23 0512 01/28/23 0516  BP: 101/76 110/82 95/65 95/65   Pulse: 85 88 92 82  Resp: 16  18 18   Temp: 98.2 F (36.8 C) 98.4 F (36.9 C) 97.8 F (36.6 C) 97.8 F (36.6 C)  TempSrc: Oral Oral    SpO2: 95%  100% 99%  Weight:      Height:        Intake/Output Summary (Last 24 hours) at 01/28/2023 0952 Last data filed at 01/28/2023 0300 Gross per 24 hour  Intake  1625.78 ml  Output --  Net 1625.78 ml   Filed Weights   01/26/23 1742  Weight: 60.8 kg    Examination:  Physical Exam Vitals and nursing note reviewed.  Constitutional:      General: She is not in acute distress.    Appearance: She is normal weight. She is not toxic-appearing or diaphoretic.     Comments: Appears chronically ill.  Flat affect.  HENT:     Head: Normocephalic and atraumatic.     Nose: Nose normal.  Eyes:     General: No scleral icterus. Cardiovascular:     Rate and Rhythm: Normal rate and regular rhythm.     Pulses: Normal pulses.   Pulmonary:     Effort: Pulmonary effort is normal.     Breath sounds: Normal breath sounds.  Abdominal:     General: Bowel sounds are normal. There is no distension.     Tenderness: There is no abdominal tenderness.  Musculoskeletal:     Right lower leg: No edema.     Left lower leg: No edema.  Skin:    General: Skin is warm and dry.     Capillary Refill: Capillary refill takes less than 2 seconds.  Neurological:     General: No focal deficit present.     Mental Status: She is alert and oriented to person, place, and time.  Psychiatric:        Mood and Affect: Affect is flat.     Data Reviewed: I have personally reviewed following labs and imaging studies  CBC: Recent Labs  Lab 01/26/23 1830 01/27/23 0216  WBC 6.6 6.2  HGB 12.3 10.1*  HCT 35.3* 29.0*  MCV 105.7* 106.2*  PLT 414* 321   Basic Metabolic Panel: Recent Labs  Lab 01/26/23 1830 01/27/23 0216 01/28/23 0426  NA 136 133* 133*  K 2.7* 2.6* 4.8  CL 93* 96* 103  CO2 25 24 24   GLUCOSE 131* 134* 100*  BUN 42* 39* 23  CREATININE 3.33* 2.94* 1.61*  CALCIUM 9.3 8.2* 7.9*  MG  --  1.9 2.3   GFR: Estimated Creatinine Clearance: 32.6 mL/min (A) (by C-G formula based on SCr of 1.61 mg/dL (H)). Liver Function Tests: Recent Labs  Lab 01/26/23 1830 01/28/23 0426  AST 39 26  ALT 19 14  ALKPHOS 126 89  BILITOT 1.0 0.6  PROT 7.8 5.2*  ALBUMIN 3.2* 2.1*   Recent Labs  Lab 01/26/23 1830  LIPASE 30   Thyroid Function Tests: Recent Labs    01/27/23 0216  TSH 0.675   Sepsis Labs: Recent Labs  Lab 01/27/23 0216  LATICACIDVEN 1.7   Radiology Studies: CT ABDOMEN PELVIS WO CONTRAST  Result Date: 01/26/2023 CLINICAL DATA:  Abdominal pain, acute, nonlocalized. Nausea vomiting and diarrhea. Acute kidney injury. EXAM: CT ABDOMEN AND PELVIS WITHOUT CONTRAST TECHNIQUE: Multidetector CT imaging of the abdomen and pelvis was performed following the standard protocol without IV contrast. RADIATION DOSE  REDUCTION: This exam was performed according to the departmental dose-optimization program which includes automated exposure control, adjustment of the mA and/or kV according to patient size and/or use of iterative reconstruction technique. COMPARISON:  06/13/2022 FINDINGS: Lower chest: Lung bases are clear. Hepatobiliary: Liver parenchyma appears normal without contrast. No calcified gallstones. Previously seen steatosis has largely resolved. Pancreas: Normal Spleen: Normal Adrenals/Urinary Tract: Adrenal glands are normal. Kidneys are normal by CT. Normal size. No mass, stone or hydronephrosis. Bladder is normal. Stomach/Bowel: Small hiatal hernia. Stomach otherwise normal. Normal small bowel appearance.  Mild diverticulosis of the left colon but without evidence of diverticulitis. Vascular/Lymphatic: Aortic atherosclerosis. No aneurysm. IVC is normal. No adenopathy. Reproductive: No pelvic mass.  Small calcified leiomyomas. Other: No free fluid or air. Musculoskeletal: Normal IMPRESSION: 1. No acute finding. No abnormality seen to explain the clinical presentation. 2. Small hiatal hernia. Mild diverticulosis of the left colon but without evidence of diverticulitis. 3. Aortic atherosclerosis. 4. Small calcified leiomyomas of the uterus. Aortic Atherosclerosis (ICD10-I70.0). Electronically Signed   By: Paulina Fusi M.D.   On: 01/26/2023 21:35   DG Knee Complete 4 Views Right  Result Date: 01/26/2023 CLINICAL DATA:  Recent fall with right knee pain, initial encounter EXAM: RIGHT KNEE - COMPLETE 4+ VIEW COMPARISON:  None Available. FINDINGS: No evidence of fracture, dislocation, or joint effusion. No evidence of arthropathy or other focal bone abnormality. Soft tissues are unremarkable. IMPRESSION: No acute abnormality noted. Electronically Signed   By: Alcide Clever M.D.   On: 01/26/2023 19:27    Scheduled Meds:  enoxaparin (LOVENOX) injection  30 mg Subcutaneous Q24H   Continuous Infusions:  lactated  ringers 100 mL/hr at 01/28/23 0607     LOS: 1 day   Time spent: 35 minutes  Carollee Herter, DO  [facility]  01/28/2023, 9:52 AM

## 2023-01-29 DIAGNOSIS — N179 Acute kidney failure, unspecified: Secondary | ICD-10-CM | POA: Diagnosis not present

## 2023-01-29 LAB — COMPREHENSIVE METABOLIC PANEL
ALT: 15 U/L (ref 0–44)
AST: 24 U/L (ref 15–41)
Albumin: 2.1 g/dL — ABNORMAL LOW (ref 3.5–5.0)
Alkaline Phosphatase: 82 U/L (ref 38–126)
Anion gap: 7 (ref 5–15)
BUN: 14 mg/dL (ref 8–23)
CO2: 25 mmol/L (ref 22–32)
Calcium: 8.1 mg/dL — ABNORMAL LOW (ref 8.9–10.3)
Chloride: 102 mmol/L (ref 98–111)
Creatinine, Ser: 0.9 mg/dL (ref 0.44–1.00)
GFR, Estimated: >60 mL/min (ref >60)
Glucose, Bld: 88 mg/dL (ref 70–99)
Potassium: 4.1 mmol/L (ref 3.5–5.1)
Sodium: 134 mmol/L — ABNORMAL LOW (ref 135–145)
Total Bilirubin: 0.6 mg/dL (ref 0.3–1.2)
Total Protein: 5.1 g/dL — ABNORMAL LOW (ref 6.5–8.1)

## 2023-01-29 LAB — MAGNESIUM: Magnesium: 1.6 mg/dL — ABNORMAL LOW (ref 1.7–2.4)

## 2023-01-29 MED ORDER — MAGNESIUM 200 MG PO TABS: 400.0000 mg | ORAL_TABLET | Freq: Every day | ORAL | 0 refills | Status: AC

## 2023-01-29 MED ORDER — MAGNESIUM SULFATE 2 GM/50ML IV SOLN
2.0000 g | Freq: Once | INTRAVENOUS | Status: AC
  Administered 2023-01-29: 2 g via INTRAVENOUS
  Filled 2023-01-29: qty 50

## 2023-01-29 NOTE — TOC Transition Note (Addendum)
Transition of Care [facility]) - CM/SW Discharge Note   Patient Details  Name: Stacey Oconnor MRN: [id number] Date of Birth: 09/28/1955  [facility] CM/SW Contact:  Tom-Johnson, Hershal Coria, RN Phone Number: 01/29/2023, 11:31 AM   Clinical Narrative:     Patient is scheduled for discharge today.  Readmission Risk Assessment done. Outpatient referral, hospital f/u and discharge instructions on AVS. Rollator ordered from [employer] and Earna Coder to deliver to patient at bedside.  Sister in-law to transport at discharge.  No further TOC needs noted.        Final next level of care: OP Rehab Barriers to Discharge: Barriers Resolved   Patient Goals and CMS Choice CMS Medicare.gov Compare Post Acute Care list provided to:: Patient Choice offered to / list presented to : Patient  Discharge Placement                  Patient to be transferred to facility by: Sister in-law Name of family member notified: Velna Hatchet    Discharge Plan and Services Additional resources added to the After Visit Summary for                  DME Arranged: Walker rolling with seat DME Agency: [employer] Date DME Agency Contacted: 01/29/23 Time DME Agency Contacted: 1120 Representative spoke with at DME Agency: Earna Coder HH Arranged: NA HH Agency: NA        Social Determinants of Health (SDOH) Interventions SDOH Screenings   Food Insecurity: No Food Insecurity (01/27/2023)  Housing: Low Risk  (01/27/2023)  Transportation Needs: No Transportation Needs (01/27/2023)  Utilities: Not At Risk (01/27/2023)  Tobacco Use: Low Risk  (01/27/2023)     Readmission Risk Interventions    01/27/2023    4:50 PM  Readmission Risk Prevention Plan  Post Dischage Appt Complete  Medication Screening Complete  Transportation Screening Complete

## 2023-01-29 NOTE — Progress Notes (Incomplete)
PROGRESS NOTE    Stacey Oconnor  VWU:[id number] DOB: July 13, 1955 DOA: 01/26/2023 PCP: Fleet Contras, MD   Brief Narrative: ***   Assessment & Plan:   Principal Problem:   AKI (acute kidney injury) (HCC) Active Problems:   Nausea vomiting and diarrhea   High anion gap metabolic acidosis   History of ETOH abuse   ***                   Estimated body mass index is 21.63 kg/m as calculated from the following:   Height as of this encounter: 5\' 6"  (1.676 m).   Weight as of this encounter: 60.8 kg.   DVT prophylaxis:  Code Status:  Family Communication: Disposition Plan:  Status is: Inpatient {Inpatient:23812}    Consultants:  ***  Procedures:  ***  Antimicrobials:    Subjective: ***  Objective: Vitals:   01/28/23 1704 01/28/23 2047 01/28/23 2047 01/29/23 0551  BP: 107/85 106/76 106/76 101/74  Pulse: 93 89 90 91  Resp: 16 19 19 19   Temp: 98.1 F (36.7 C) 98.9 F (37.2 C) 98.9 F (37.2 C) 98.5 F (36.9 C)  TempSrc: Oral Oral Oral   SpO2: 100% 100% 100% 100%  Weight:      Height:        Intake/Output Summary (Last 24 hours) at 01/29/2023 0814 Last data filed at 01/29/2023 2956 Gross per 24 hour  Intake --  Output 0 ml  Net 0 ml   Filed Weights   01/26/23 1742  Weight: 60.8 kg    Examination:  General exam: Appears calm and comfortable  Respiratory system: Clear to auscultation. Respiratory effort normal. Cardiovascular system: S1 & S2 heard, RRR. No JVD, murmurs, rubs, gallops or clicks. No pedal edema. Gastrointestinal system: Abdomen is nondistended, soft and nontender. No organomegaly or masses felt. Normal bowel sounds heard. Central nervous system: Alert and oriented. No focal neurological deficits. Extremities: Symmetric 5 x 5 power. Skin: No rashes, lesions or ulcers Psychiatry: Judgement and insight appear normal. Mood & affect appropriate.     Data Reviewed: I have personally reviewed following labs and imaging  studies  CBC: Recent Labs  Lab 01/26/23 1830 01/27/23 0216  WBC 6.6 6.2  HGB 12.3 10.1*  HCT 35.3* 29.0*  MCV 105.7* 106.2*  PLT 414* 321   Basic Metabolic Panel: Recent Labs  Lab 01/26/23 1830 01/27/23 0216 01/28/23 0426 01/29/23 0424  NA 136 133* 133* 134*  K 2.7* 2.6* 4.8 4.1  CL 93* 96* 103 102  CO2 25 24 24 25   GLUCOSE 131* 134* 100* 88  BUN 42* 39* 23 14  CREATININE 3.33* 2.94* 1.61* 0.90  CALCIUM 9.3 8.2* 7.9* 8.1*  MG  --  1.9 2.3 1.6*   GFR: Estimated Creatinine Clearance: 58.3 mL/min (by C-G formula based on SCr of 0.9 mg/dL). Liver Function Tests: Recent Labs  Lab 01/26/23 1830 01/28/23 0426 01/29/23 0424  AST 39 26 24  ALT 19 14 15   ALKPHOS 126 89 82  BILITOT 1.0 0.6 0.6  PROT 7.8 5.2* 5.1*  ALBUMIN 3.2* 2.1* 2.1*   Recent Labs  Lab 01/26/23 1830  LIPASE 30   No results for input(s): "AMMONIA" in the last 168 hours. Coagulation Profile: No results for input(s): "INR", "PROTIME" in the last 168 hours. Cardiac Enzymes: No results for input(s): "CKTOTAL", "CKMB", "CKMBINDEX", "TROPONINI" in the last 168 hours. BNP (last 3 results) No results for input(s): "PROBNP" in the last 8760 hours. HbA1C: No results for input(s): "HGBA1C" in  the last 72 hours. CBG: No results for input(s): "GLUCAP" in the last 168 hours. Lipid Profile: No results for input(s): "CHOL", "HDL", "LDLCALC", "TRIG", "CHOLHDL", "LDLDIRECT" in the last 72 hours. Thyroid Function Tests: Recent Labs    01/27/23 0216  TSH 0.675   Anemia Panel: No results for input(s): "VITAMINB12", "FOLATE", "FERRITIN", "TIBC", "IRON", "RETICCTPCT" in the last 72 hours. Sepsis Labs: Recent Labs  Lab 01/27/23 0216  LATICACIDVEN 1.7    No results found for this or any previous visit (from the past 240 hour(s)).       Radiology Studies: No results found.      Scheduled Meds:  enoxaparin (LOVENOX) injection  30 mg Subcutaneous Q24H   Continuous Infusions:  magnesium  sulfate bolus IVPB       LOS: 2 days    Time spent: 35 minutes    Stacey Oconnor A Mionna Advincula, MD [facility]   If 7PM-7AM, please contact night-coverage [email or web address]  01/29/2023, 8:14 AM

## 2023-01-29 NOTE — Progress Notes (Signed)
    Durable Medical Equipment  (From admission, onward)           Start     Ordered   01/29/23 1200  For home use only DME 4 wheeled rolling walker with seat  Once       Comments: Balance Disorder  Question:  Patient needs a walker to treat with the following condition  Answer:  Weakness   01/29/23 1200

## 2023-01-29 NOTE — Discharge Summary (Addendum)
Physician Discharge Summary   Patient: Stacey Oconnor MRN: [id number] DOB: 1956/01/23  Admit date:     01/26/2023  Discharge date: 01/29/23  Discharge Physician: Alba Cory   PCP: Fleet Contras, MD   Recommendations at discharge:    Need B-met to monitor renal function.  If vomiting reoccur needs GI evaluation.   Discharge Diagnoses: Principal Problem:   AKI (acute kidney injury) (HCC) Active Problems:   Nausea vomiting and diarrhea   High anion gap metabolic acidosis   History of ETOH abuse  Resolved Problems:   * No resolved hospital problems. *  Hospital Course: 67 year old with past medical history significant for alcohol use, presents with generalized weakness, nausea vomiting diarrhea frequent fall, poor appetite.  She denies alcohol intake since June.  Was found to be in AKI.  Treated with IV fluids.  CT abdomen and pelvis negative for acute finding.  Patient improved with supportive care.  She has been able to tolerate diet.  Assessment and Plan:  AKI (acute kidney injury) (HCC) Admitted on 01-26-2023 for AKI.   Creatinine peak 3.3.  In the setting of nausea vomiting and diarrhea   High anion gap metabolic acidosis Admitting AG 18, BICARB 25 Lactic acid normal In the setting of AKI and dehydration Treated with IV fluids   Nausea vomiting and diarrhea; likely gastroenteritis Zofran PRN CT AP without acute findings to explain. Resolved with supportive care, tolerating diet  History of ETOH abuse Denies recent alcohol intake No evidence of withdrawal  Hypomagnesemia; replaced.       Consultants: None Procedures performed: none Disposition: Home Diet recommendation:  Discharge Diet Orders (From admission, onward)     Start     Ordered   01/29/23 0000  Diet - low sodium heart healthy        01/29/23 1054           Regular diet DISCHARGE MEDICATION: Allergies as of 01/29/2023   No Known Allergies      Medication List     TAKE  these medications    Magnesium 200 MG Tabs Take 2 tablets (400 mg total) by mouth daily.   multivitamin with minerals Tabs tablet Take 1 tablet by mouth daily.   polyethylene glycol 17 g packet Commonly known as: MIRALAX / GLYCOLAX Take 17 g by mouth daily as needed for mild constipation.   tetrahydrozoline 0.05 % ophthalmic solution Place 1 drop into both eyes daily as needed (dry eyes).   VITAMIN A PO Take 1 tablet by mouth daily.   VITAMIN B-1 PO Take 1 tablet by mouth daily.   Vitamin D (Ergocalciferol) 1.25 MG (50000 UNIT) Caps capsule Commonly known as: DRISDOL Take 50,000 Units by mouth once a week.               Durable Medical Equipment  (From admission, onward)           Start     Ordered   01/29/23 1053  For home use only DME Walker rolling  Once       Question Answer Comment  Walker: With 5 Inch Wheels   Patient needs a walker to treat with the following condition Balance disorder      01/29/23 1053            Follow-up Information     [facility] Follow up.   Specialty: Rehabilitation Why: Call to schedule first appointment Contact information: [address] [place] [address]  [phone]        Fleet Contras, MD Follow up in 1 week(s).   Specialty: Internal Medicine Contact information: [address] [place] [address] [phone]                Discharge Exam: Ceasar Mons Weights   01/26/23 1742  Weight: 60.8 kg   General; NAD  Condition at discharge: stable  The results of significant diagnostics from this hospitalization (including imaging, microbiology, ancillary and laboratory) are listed below for reference.   Imaging Studies: CT ABDOMEN PELVIS WO CONTRAST  Result Date: 01/26/2023 CLINICAL DATA:  Abdominal pain, acute, nonlocalized. Nausea vomiting and diarrhea. Acute kidney injury. EXAM: CT ABDOMEN AND PELVIS WITHOUT CONTRAST  TECHNIQUE: Multidetector CT imaging of the abdomen and pelvis was performed following the standard protocol without IV contrast. RADIATION DOSE REDUCTION: This exam was performed according to the departmental dose-optimization program which includes automated exposure control, adjustment of the mA and/or kV according to patient size and/or use of iterative reconstruction technique. COMPARISON:  06/13/2022 FINDINGS: Lower chest: Lung bases are clear. Hepatobiliary: Liver parenchyma appears normal without contrast. No calcified gallstones. Previously seen steatosis has largely resolved. Pancreas: Normal Spleen: Normal Adrenals/Urinary Tract: Adrenal glands are normal. Kidneys are normal by CT. Normal size. No mass, stone or hydronephrosis. Bladder is normal. Stomach/Bowel: Small hiatal hernia. Stomach otherwise normal. Normal small bowel appearance. Mild diverticulosis of the left colon but without evidence of diverticulitis. Vascular/Lymphatic: Aortic atherosclerosis. No aneurysm. IVC is normal. No adenopathy. Reproductive: No pelvic mass.  Small calcified leiomyomas. Other: No free fluid or air. Musculoskeletal: Normal IMPRESSION: 1. No acute finding. No abnormality seen to explain the clinical presentation. 2. Small hiatal hernia. Mild diverticulosis of the left colon but without evidence of diverticulitis. 3. Aortic atherosclerosis. 4. Small calcified leiomyomas of the uterus. Aortic Atherosclerosis (ICD10-I70.0). Electronically Signed   By: Paulina Fusi M.D.   On: 01/26/2023 21:35   DG Knee Complete 4 Views Right  Result Date: 01/26/2023 CLINICAL DATA:  Recent fall with right knee pain, initial encounter EXAM: RIGHT KNEE - COMPLETE 4+ VIEW COMPARISON:  None Available. FINDINGS: No evidence of fracture, dislocation, or joint effusion. No evidence of arthropathy or other focal bone abnormality. Soft tissues are unremarkable. IMPRESSION: No acute abnormality noted. Electronically Signed   By: Alcide Clever M.D.    On: 01/26/2023 19:27    Microbiology: Results for orders placed or performed during the hospital encounter of 06/30/22  Resp panel by RT-PCR (RSV, Flu A&B, Covid) Urine, Clean Catch     Status: None   Collection Time: 06/30/22  1:30 PM   Specimen: Urine, Clean Catch; Nasal Swab  Result Value Ref Range Status   SARS Coronavirus 2 by RT PCR NEGATIVE NEGATIVE Final   Influenza A by PCR NEGATIVE NEGATIVE Final   Influenza B by PCR NEGATIVE NEGATIVE Final    Comment: (NOTE) The Xpert Xpress SARS-CoV-2/FLU/RSV plus assay is intended as an aid in the diagnosis of influenza from Nasopharyngeal swab specimens and should not be used as a sole basis for treatment. Nasal washings and aspirates are unacceptable for Xpert Xpress SARS-CoV-2/FLU/RSV testing.  Fact Sheet for Patients: BloggerCourse.com  Fact Sheet for Healthcare Providers: SeriousBroker.it  This test is not yet approved or cleared by the [place] FDA and has been authorized for detection and/or diagnosis of SARS-CoV-2 by FDA under an Emergency Use Authorization (EUA). This EUA will remain in effect (meaning this test can be used) for the duration of the COVID-19 declaration under  Section 564(b)(1) of the Act, 21 U.S.C. section 360bbb-3(b)(1), unless the authorization is terminated or revoked.     Resp Syncytial Virus by PCR NEGATIVE NEGATIVE Final    Comment: (NOTE) Fact Sheet for Patients: BloggerCourse.com  Fact Sheet for Healthcare Providers: SeriousBroker.it  This test is not yet approved or cleared by the [place] FDA and has been authorized for detection and/or diagnosis of SARS-CoV-2 by FDA under an Emergency Use Authorization (EUA). This EUA will remain in effect (meaning this test can be used) for the duration of the COVID-19 declaration under Section 564(b)(1) of the Act, 21 U.S.C. section 360bbb-3(b)(1),  unless the authorization is terminated or revoked.  Performed at [facility], [address], [place], [place] [address]     Labs: CBC: Recent Labs  Lab 01/26/23 1830 01/27/23 0216  WBC 6.6 6.2  HGB 12.3 10.1*  HCT 35.3* 29.0*  MCV 105.7* 106.2*  PLT 414* 321   Basic Metabolic Panel: Recent Labs  Lab 01/26/23 1830 01/27/23 0216 01/28/23 0426 01/29/23 0424  NA 136 133* 133* 134*  K 2.7* 2.6* 4.8 4.1  CL 93* 96* 103 102  CO2 25 24 24 25   GLUCOSE 131* 134* 100* 88  BUN 42* 39* 23 14  CREATININE 3.33* 2.94* 1.61* 0.90  CALCIUM 9.3 8.2* 7.9* 8.1*  MG  --  1.9 2.3 1.6*   Liver Function Tests: Recent Labs  Lab 01/26/23 1830 01/28/23 0426 01/29/23 0424  AST 39 26 24  ALT 19 14 15   ALKPHOS 126 89 82  BILITOT 1.0 0.6 0.6  PROT 7.8 5.2* 5.1*  ALBUMIN 3.2* 2.1* 2.1*   CBG: No results for input(s): "GLUCAP" in the last 168 hours.  Discharge time spent: greater than 30 minutes.  Signed: Alba Cory, MD [facility] 01/29/2023

## 2023-01-29 NOTE — Progress Notes (Signed)
Physical Therapy Treatment Patient Details Name: Stacey Oconnor MRN: [id number] DOB: 1956-04-14 Today's Date: 01/29/2023   History of Present Illness Pt is 67 yo female who presents on 01/26/23 with weakness, N/V/D, falls, poor appetite. Pt in acute kidney failure. PMH: EtOH abuse    PT Comments  Pt received in supine, agreeable to therapy session and with good participation in transfer, gait and stair training. Pt had received rollator and measured for proper height, then pt performed gait/stair trial in hallway with rollator. Pt mostly Supervision for min safety reminders for use of brakes/activity pacing and wayfinding in hallway. Pt CGA to Supervision to perform steps to simulate home entry. Pt continues to benefit from PT services to progress toward functional mobility goals.     If plan is discharge home, recommend the following: Assistance with cooking/housework   Can travel by Pension scheme manager (4 wheels)    Recommendations for Other Services       Precautions / Restrictions Precautions Precautions: Fall Precaution Comments: has fallen numerous times recently, hurt R knee 2 falls back Restrictions Weight Bearing Restrictions: No     Mobility  Bed Mobility Overal bed mobility: Modified Independent             General bed mobility comments: no assist needed, HOB already elevated upon PTA arrival to room    Transfers Overall transfer level: Needs assistance Equipment used: Rollator (4 wheels) Transfers: Sit to/from Stand Sit to Stand: Supervision           General transfer comment: from EOB and to/from toilet, no lift assist needed, just needs reminder for use of brakes.    Ambulation/Gait Ambulation/Gait assistance: Supervision Gait Distance (Feet): 200 Feet Assistive device: Rollator (4 wheels) Gait Pattern/deviations: Step-through pattern Gait velocity: functional     General Gait Details: no LOB, good use  of AD; did not need seated break   Stairs Stairs: Yes Stairs assistance: Contact guard assist Stair Management: One rail Left, Step to pattern, Forwards Number of Stairs: 6 General stair comments: cues for step-to pattern due to c/o R knee pain   Wheelchair Mobility     Tilt Bed    Modified Rankin (Stroke Patients Only)       Balance Overall balance assessment: History of Falls, Needs assistance Sitting-balance support: No upper extremity supported, Feet supported Sitting balance-Leahy Scale: Good     Standing balance support: No upper extremity supported Standing balance-Leahy Scale: Good Standing balance comment: good for short distance without AD but used 4WW due to pt R knee pain                            Cognition Arousal: Alert Behavior During Therapy: Flat affect Overall Cognitive Status: Within Functional Limits for tasks assessed                                 General Comments: appears oriented throughout, participatory, expressed eagerness to DC today        Exercises      General Comments General comments (skin integrity, edema, etc.): SpO2/HR WFL, no acute s/sx distress      Pertinent Vitals/Pain Pain Assessment Pain Assessment: Faces Faces Pain Scale: Hurts little more Pain Location: R knee wtih stairs Pain Descriptors / Indicators: Sore Pain Intervention(s): Monitored during session, Repositioned    Home Living  Prior Function            PT Goals (current goals can now be found in the care plan section) Acute Rehab PT Goals Patient Stated Goal: return home PT Goal Formulation: With patient Time For Goal Achievement: 02/10/23 Progress towards PT goals: Progressing toward goals    Frequency    Min 1X/week      PT Plan      Co-evaluation              AM-PAC PT "6 Clicks" Mobility   Outcome Measure  Help needed turning from your back to your side while in  a flat bed without using bedrails?: None Help needed moving from lying on your back to sitting on the side of a flat bed without using bedrails?: None Help needed moving to and from a bed to a chair (including a wheelchair)?: None Help needed standing up from a chair using your arms (e.g., wheelchair or bedside chair)?: A Little Help needed to walk in hospital room?: A Little Help needed climbing 3-5 steps with a railing? : A Little 6 Click Score: 21    End of Session Equipment Utilized During Treatment: Gait belt Activity Tolerance: Patient tolerated treatment well Patient left: in bed;with call bell/phone within reach;with nursing/sitter in room (RN entering room to review DC paperwork) Nurse Communication: Mobility status PT Visit Diagnosis: Unsteadiness on feet (R26.81);Pain Pain - Right/Left: Right Pain - part of body: Knee     Time: 1301-1310 PT Time Calculation (min) (ACUTE ONLY): 9 min  Charges:    $Gait Training: 8-22 mins PT General Charges $$ ACUTE PT VISIT: 1 Visit                     Maiana Hennigan P., PTA Acute Rehabilitation Services Secure Chat Preferred 9a-5:30pm Office: [phone]    Dorathy Kinsman [facility] 01/29/2023, 4:30 PM

## 2023-01-29 NOTE — Plan of Care (Signed)

## 2023-02-21 ENCOUNTER — Ambulatory Visit: Payer: Medicare HMO | Admitting: Occupational Therapy

## 2023-02-21 ENCOUNTER — Ambulatory Visit: Payer: Medicare HMO | Attending: Internal Medicine | Admitting: Physical Therapy

## 2023-02-21 NOTE — Therapy (Deleted)
OUTPATIENT OCCUPATIONAL THERAPY NEURO EVALUATION  Patient Name: Stacey Oconnor MRN: [id number] DOB:1955-07-15, 67 y.o., female Today's Date: 02/21/2023  PCP: Fleet Contras, MD  REFERRING PROVIDER: Carollee Herter, DO   END OF SESSION:   Past Medical History:  Diagnosis Date   Ankle fracture    Left   Ankle fracture, bimalleolar, closed 09/26/2018   Nausea vomiting and diarrhea 01/26/2023   Past Surgical History:  Procedure Laterality Date   ABSCESS DRAINAGE     Left Buttock   ORIF ANKLE FRACTURE Left 09/26/2018   Procedure: OPEN REDUCTION INTERNAL FIXATION (ORIF) ANKLE FRACTURE;  Surgeon: Cammy Copa, MD;  Location: [facility] OR;  Service: Orthopedics;  Laterality: Left;   Patient Active Problem List   Diagnosis Date Noted   Nausea vomiting and diarrhea 01/26/2023   High anion gap metabolic acidosis 01/26/2023   History of ETOH abuse 01/26/2023   Generalized weakness 06/13/2022   AKI (acute kidney injury) (HCC) 06/13/2022    ONSET DATE: 01/27/23 (referral date)  REFERRING DIAG:  N17.9 (ICD-10-CM) - AKI (acute kidney injury) (HCC)  R53.1 (ICD-10-CM) - Generalized weakness    THERAPY DIAG:  No diagnosis found.  Rationale for Evaluation and Treatment: Rehabilitation  SUBJECTIVE:   SUBJECTIVE STATEMENT: *** Pt accompanied by: {accompnied:27141}  PERTINENT HISTORY: Per 01/29/23 MD D/C Summary: "67 year old with past medical history significant for alcohol use, presents with generalized weakness, nausea vomiting diarrhea frequent fall, poor appetite.  She denies alcohol intake since June.  Was found to be in AKI.  Treated with IV fluids.  CT abdomen and pelvis negative for acute finding.  Patient improved with supportive care.  She has been able to tolerate diet."   PRECAUTIONS: Fall, hx of repeated falls  WEIGHT BEARING RESTRICTIONS: {Yes ***/No:24003}  PAIN:  Are you having pain? {OPRCPAIN:27236}  FALLS: Has patient fallen in last 6 months? Yes. Number of falls  ***. Per 01/29/23 Acute Rehab PT Progress Notes, pt "has fallen numerous times recently, hurt R knee 2 falls back."  LIVING ENVIRONMENT: Lives with: {OPRC lives with:25569::"lives with their family"} Lives in: {Lives in:25570} Stairs: {opstairs:27293} Has following equipment at home: {Assistive devices:23999}  PLOF: {PLOF:24004}  PATIENT GOALS: ***  OBJECTIVE:  Note: Objective measures were completed at Evaluation unless otherwise noted.  HAND DOMINANCE: {MISC; OT HAND DOMINANCE:[id number]}  ADLs: Overall ADLs: *** Transfers/ambulation related to ADLs: Eating: *** Grooming: *** UB Dressing: *** LB Dressing: *** Toileting: *** Bathing: *** Tub Shower transfers: *** Equipment: {equipment:25573}  IADLs: Shopping: *** Light housekeeping: *** Meal Prep: *** Community mobility: *** Medication management: *** Financial management: *** Handwriting: {OTWRITTENEXPRESSION:25361}  MOBILITY STATUS: {OTMOBILITY:25360}  POSTURE COMMENTS:  {posture:25561} Sitting balance: {sitting balance:25483}  ACTIVITY TOLERANCE: Activity tolerance: ***  FUNCTIONAL OUTCOME MEASURES: {OTFUNCTIONALMEASURES:27238}  UPPER EXTREMITY ROM:    {AROM/PROM:27142} ROM Right eval Left eval  Shoulder flexion    Shoulder abduction    Shoulder adduction    Shoulder extension    Shoulder internal rotation    Shoulder external rotation    Elbow flexion    Elbow extension    Wrist flexion    Wrist extension    Wrist ulnar deviation    Wrist radial deviation    Wrist pronation    Wrist supination    (Blank rows = not tested)  UPPER EXTREMITY MMT:     MMT Right eval Left eval  Shoulder flexion    Shoulder abduction    Shoulder adduction    Shoulder extension    Shoulder internal rotation  Shoulder external rotation    Middle trapezius    Lower trapezius    Elbow flexion    Elbow extension    Wrist flexion    Wrist extension    Wrist ulnar deviation    Wrist radial deviation     Wrist pronation    Wrist supination    (Blank rows = not tested)  HAND FUNCTION: {handfunction:27230}  COORDINATION: {otcoordination:27237}  SENSATION: {sensation:27233}  EDEMA: ***  MUSCLE TONE: {UETONE:25567}  COGNITION: Overall cognitive status: {cognition:24006}  VISION: Subjective report: *** Baseline vision: {OTBASELINEVISION:25363} Visual history: {OTVISUALHISTORY:25364}  VISION ASSESSMENT: {visionassessment:27231}  Patient has difficulty with following activities due to following visual impairments: ***  PERCEPTION: {Perception:25564}  PRAXIS: {Praxis:25565}  OBSERVATIONS: ***   TODAY'S TREATMENT:                                                                                                                              DATE: ***   PATIENT EDUCATION: Education details: *** Person educated: {Person educated:25204} Education method: {Education Method:25205} Education comprehension: {Education Comprehension:25206}  HOME EXERCISE PROGRAM: ***   GOALS: Goals reviewed with patient? {yes/no:20286}  SHORT TERM GOALS: Target date: ***  *** Baseline: Goal status: INITIAL  2.  *** Baseline:  Goal status: INITIAL  3.  *** Baseline:  Goal status: INITIAL  4.  *** Baseline:  Goal status: INITIAL  5.  *** Baseline:  Goal status: INITIAL  6.  *** Baseline:  Goal status: INITIAL  LONG TERM GOALS: Target date: ***  *** Baseline:  Goal status: INITIAL  2.  *** Baseline:  Goal status: INITIAL  3.  *** Baseline:  Goal status: INITIAL  4.  *** Baseline:  Goal status: INITIAL  5.  *** Baseline:  Goal status: INITIAL  6.  *** Baseline:  Goal status: INITIAL  ASSESSMENT:  CLINICAL IMPRESSION: Patient is a *** y.o. *** who was seen today for occupational therapy evaluation for ***.   PERFORMANCE DEFICITS: in functional skills including {OT physical skills:25468}, cognitive skills including {OT cognitive skills:25469}, and  psychosocial skills including {OT psychosocial skills:25470}.   IMPAIRMENTS: are limiting patient from {OT performance deficits:25471}.   CO-MORBIDITIES: {Comorbidities:25485} that affects occupational performance. Patient will benefit from skilled OT to address above impairments and improve overall function.  MODIFICATION OR ASSISTANCE TO COMPLETE EVALUATION: {OT modification:25474}  OT OCCUPATIONAL PROFILE AND HISTORY: {OT PROFILE AND HISTORY:25484}  CLINICAL DECISION MAKING: {OT CDM:25475}  REHAB POTENTIAL: {rehabpotential:25112}  EVALUATION COMPLEXITY: {Evaluation complexity:25115}    PLAN:  OT FREQUENCY: {rehab frequency:25116}  OT DURATION: {rehab duration:25117}  PLANNED INTERVENTIONS: {OT Interventions:25467}  RECOMMENDED OTHER SERVICES: ***  CONSULTED AND AGREED WITH PLAN OF CARE: {ZOX:09604}  PLAN FOR NEXT SESSION: ***   Wynetta Emery, OT 02/21/2023, 7:46 AM

## 2024-01-02 ENCOUNTER — Other Ambulatory Visit: Payer: Self-pay

## 2024-01-02 ENCOUNTER — Emergency Department (HOSPITAL_COMMUNITY)

## 2024-01-02 ENCOUNTER — Encounter (HOSPITAL_COMMUNITY): Payer: Self-pay

## 2024-01-02 ENCOUNTER — Emergency Department (HOSPITAL_COMMUNITY)
Admission: EM | Admit: 2024-01-02 | Discharge: 2024-01-02 | Discharge status: Against Medical Advice | Attending: Emergency Medicine | Admitting: Emergency Medicine

## 2024-01-02 DIAGNOSIS — I2489 Other forms of acute ischemic heart disease: Secondary | ICD-10-CM | POA: Insufficient documentation

## 2024-01-02 DIAGNOSIS — R5381 Other malaise: Secondary | ICD-10-CM

## 2024-01-02 DIAGNOSIS — N179 Acute kidney failure, unspecified: Secondary | ICD-10-CM | POA: Insufficient documentation

## 2024-01-02 DIAGNOSIS — R42 Dizziness and giddiness: Secondary | ICD-10-CM | POA: Diagnosis not present

## 2024-01-02 DIAGNOSIS — R110 Nausea: Secondary | ICD-10-CM | POA: Diagnosis present

## 2024-01-02 LAB — CBC
HCT: 29.4 % — ABNORMAL LOW (ref 36.0–46.0)
Hemoglobin: 10.1 g/dL — ABNORMAL LOW (ref 12.0–15.0)
MCH: 36.6 pg — ABNORMAL HIGH (ref 26.0–34.0)
MCHC: 34.4 g/dL (ref 30.0–36.0)
MCV: 106.5 fL — ABNORMAL HIGH (ref 80.0–100.0)
Platelets: 179 K/uL (ref 150–400)
RBC: 2.76 MIL/uL — ABNORMAL LOW (ref 3.87–5.11)
RDW: 12.6 % (ref 11.5–15.5)
WBC: 3.8 K/uL — ABNORMAL LOW (ref 4.0–10.5)
nRBC: 0 % (ref 0.0–0.2)

## 2024-01-02 LAB — URINALYSIS, ROUTINE W REFLEX MICROSCOPIC
Bilirubin Urine: NEGATIVE
Glucose, UA: NEGATIVE mg/dL
Hgb urine dipstick: NEGATIVE
Ketones, ur: 5 mg/dL — AB
Leukocytes,Ua: NEGATIVE
Nitrite: NEGATIVE
Protein, ur: NEGATIVE mg/dL
Specific Gravity, Urine: 1.015 (ref 1.005–1.030)
pH: 5 (ref 5.0–8.0)

## 2024-01-02 LAB — COMPREHENSIVE METABOLIC PANEL WITH GFR
ALT: 33 U/L (ref 0–44)
AST: 72 U/L — ABNORMAL HIGH (ref 15–41)
Albumin: 3.2 g/dL — ABNORMAL LOW (ref 3.5–5.0)
Alkaline Phosphatase: 190 U/L — ABNORMAL HIGH (ref 38–126)
Anion gap: 16 — ABNORMAL HIGH (ref 5–15)
BUN: 49 mg/dL — ABNORMAL HIGH (ref 8–23)
CO2: 21 mmol/L — ABNORMAL LOW (ref 22–32)
Calcium: 9.2 mg/dL (ref 8.9–10.3)
Chloride: 93 mmol/L — ABNORMAL LOW (ref 98–111)
Creatinine, Ser: 1.76 mg/dL — ABNORMAL HIGH (ref 0.44–1.00)
GFR, Estimated: 32 mL/min — ABNORMAL LOW (ref >60)
Glucose, Bld: 114 mg/dL — ABNORMAL HIGH (ref 70–99)
Potassium: 3.5 mmol/L (ref 3.5–5.1)
Sodium: 130 mmol/L — ABNORMAL LOW (ref 135–145)
Total Bilirubin: 1.5 mg/dL — ABNORMAL HIGH (ref 0.0–1.2)
Total Protein: 6.5 g/dL (ref 6.5–8.1)

## 2024-01-02 LAB — I-STAT CG4 LACTIC ACID, ED: Lactic Acid, Venous: 1.1 mmol/L (ref 0.5–1.9)

## 2024-01-02 LAB — CK: Total CK: 27 U/L — ABNORMAL LOW (ref 38–234)

## 2024-01-02 LAB — CBG MONITORING, ED: Glucose-Capillary: 111 mg/dL — ABNORMAL HIGH (ref 70–99)

## 2024-01-02 LAB — TROPONIN I (HIGH SENSITIVITY)
Troponin I (High Sensitivity): 29 ng/L — ABNORMAL HIGH (ref <18)
Troponin I (High Sensitivity): 31 ng/L — ABNORMAL HIGH (ref <18)

## 2024-01-02 MED ORDER — LACTATED RINGERS IV BOLUS
1000.0000 mL | Freq: Once | INTRAVENOUS | Status: AC
  Administered 2024-01-02: 1000 mL via INTRAVENOUS

## 2024-01-02 NOTE — Discharge Instructions (Addendum)
 Stacey Oconnor  Thank you for allowing us  to take care of you today.  You came to the Emergency Department today because you had an episode of weakness while you are at the store earlier today.  Here in the emergency department we found that you have a acute kidney injury, meaning that your kidneys have suffered damage, and this is likely causing your symptoms.  We did give you fluids here in the emergency department, however given we are seeing that you it is putting stress on your heart, and your kidney numbers are significantly elevated, we want to bring you into the emergency department to watch your kidneys, give you treatment, and help determine why your kidneys are damaged.  Not coming into the emergency department could lead to permanent or worsening kidney damage, permanent disability, dependence on dialysis, or death.  You understand these risks and would like to be discharged anyway.  We recommend returning to the emergency department as soon as possible for admission and further workup.  To-Do: 1. Please return to the emergency department as soon as possible  Mitzie Later, MD Department of Emergency Medicine [facility]

## 2024-01-02 NOTE — ED Triage Notes (Addendum)
 Pt to ED via EMS with c/o weakness x several days. Pt was at the store and she felt hot and dizzy. Pt did not fall. Pt called EMS. Pt A&Ox4. Pt breathing even and unlabored. Pt given LR en route. EMS states pt was in a-fib RVR for a moment during transport.

## 2024-01-02 NOTE — ED Notes (Signed)
 Informed by MD that PT would like to leave AMA

## 2024-01-02 NOTE — ED Provider Notes (Signed)
 [facility] Provider Note   CSN: [id number] Arrival date & time: 01/02/24  1625     History Chief Complaint  Patient presents with   Weakness    HPI: Stacey Oconnor is a 68 y.o. female with history pertinent prior AKI, alcohol use disorder who presents complaining of episode of feeling poorly. Patient arrived via EMS from the store.  History provided by patient.  No interpreter required during this encounter.  Patient reports that she was in her usual state of health yesterday and all of this morning until she went to the store.  Reports that while walking around at the store without precipitating factor she started to feel generalized weakness, sweating, nauseous, lightheadedness.  Denies falling or loss of consciousness.  Denies associated fever, chest pain, shortness of breath, vomiting, diarrhea, abdominal pain.  Reports that someone else at the store called EMS and told her that she should go with them.  Reports that while and route she had an episode of urinary incontinence which is not typical for her.  Per nurse who took report from EMS, patient reportedly had a brief episode of what appeared to be A-fib with RVR and route with EMS which was not captured on EKG.  Patient denies current symptoms, when asked if she feels that if she is back to baseline, she reports no, asked what is making her feel that she is not at baseline and patient reports that she is hungry.  Patient denies known history of atrial fibrillation.  Patient's recorded medical, surgical, social, medication list and allergies were reviewed in the Snapshot window as part of the initial history.   Prior to Admission medications   Medication Sig Start Date End Date Taking? Authorizing Provider  Magnesium  200 MG TABS Take 2 tablets (400 mg total) by mouth daily. 01/29/23   Regalado, Belkys A, MD  Multiple Vitamin (MULTIVITAMIN WITH MINERALS) TABS tablet Take 1 tablet by mouth  daily.    [provider]  polyethylene glycol (MIRALAX  / GLYCOLAX ) 17 g packet Take 17 g by mouth daily as needed for mild constipation. 06/15/22   Rosario Eland I, MD  tetrahydrozoline 0.05 % ophthalmic solution Place 1 drop into both eyes daily as needed (dry eyes).    [provider]  Thiamine  HCl (VITAMIN B-1 PO) Take 1 tablet by mouth daily.    [provider]  VITAMIN A PO Take 1 tablet by mouth daily.    [provider]  Vitamin D , Ergocalciferol , (DRISDOL) 1.25 MG (50000 UNIT) CAPS capsule Take 50,000 Units by mouth once a week. 03/29/22   [provider]     Allergies: Patient has no known allergies.   Review of Systems   ROS as per HPI  Physical Exam Updated Vital Signs BP 111/78   Pulse 84   Temp 98.1 F (36.7 C) (Oral)   Resp 16   Ht 5' 6 (1.676 m)   Wt 60 kg   SpO2 100%   BMI 21.35 kg/m  Physical Exam Vitals and nursing note reviewed.  Constitutional:      General: She is not in acute distress.    Appearance: She is well-developed.  HENT:     Head: Normocephalic and atraumatic.  Eyes:     Conjunctiva/sclera: Conjunctivae normal.  Cardiovascular:     Rate and Rhythm: Normal rate and regular rhythm.     Heart sounds: No murmur heard. Pulmonary:     Effort: Pulmonary effort is normal.  No respiratory distress.     Breath sounds: Normal breath sounds.  Abdominal:     Palpations: Abdomen is soft.     Tenderness: There is no abdominal tenderness.  Musculoskeletal:        General: No swelling.     Cervical back: Neck supple.     Right lower leg: No edema.     Left lower leg: No edema.  Skin:    General: Skin is warm and dry.     Capillary Refill: Capillary refill takes less than 2 seconds.  Neurological:     Mental Status: She is alert.  Psychiatric:        Mood and Affect: Mood normal.     ED Course/ Medical Decision Making/ A&P    Procedures Procedures   Medications Ordered in ED Medications   lactated ringers  bolus 1,000 mL (0 mLs Intravenous Stopped 01/02/24 1816)    Medical Decision Making:   Stacey Oconnor is a 68 y.o. female who presents for episode of malaise and weakness as per above.  Physical exam is pertinent for no focal abnormalities.   The differential includes but is not limited to rhabdomyolysis, dehydration, orthostasis, AKI, emergent electrolyte derangement, ACS.  Independent historian: None  External data reviewed: Labs and Notes reviewed patient's prior labs for baseline values, additionally reviewed patient's prior hospitalization 11 months ago for AKI that at that time was due to GI losses  Labs: Ordered, Independent interpretation, and Details: CK below the lower limit of normal.  CBC with stable leukopenia and anemia on the comparison to prior, no thrombocytopenia.  Initial troponin elevated at 29, delta 31.  Lactic acid WNL.  UA without UTI.  CBG WNL.  CMP with multiple electrolyte derangements including hyponatremia and hypochloremia, AKI to 1.76 from baseline of 0.9, with symmetric elevation of BUN to 49  Radiology: Ordered, Independent interpretation, Details: Chest x-ray without focal airspace opacification, cardiomediastinal silhouette derangement, pneumothorax, pleural effusion, bony derangement, and All images reviewed independently.  Agree with radiology report at this time.   DG Chest Portable 1 View Result Date: 01/02/2024 CLINICAL DATA:  lightheadedness EXAM: PORTABLE CHEST - 1 VIEW COMPARISON:  December 13, 2021 FINDINGS: No focal airspace consolidation, pleural effusion, or pneumothorax. No cardiomegaly. Tortuous aorta with aortic atherosclerosis. No acute fracture or destructive lesions. Multilevel thoracic osteophytosis. IMPRESSION: No acute cardiopulmonary abnormality. Electronically Signed   By: Rogelia Myers M.D.   On: 01/02/2024 17:08    EKG/Medicine tests: Ordered and Independent interpretation EKG Interpretation: Sinus rhythm Low voltage,  extremity and precordial leads No significant change since last tracing Confirmed by Rogelia Satterfield ([id number]) on 01/02/2024 4:53:21 PM                Interventions: LR bolus  See the EMR for full details regarding lab and imaging results.  Patient overall well-appearing on exam, however patient with concerning description of event, therefore do feel that patient requires screening labs.  Given patient had diaphoresis and lightheadedness associated with episode, suspect that patient potentially had atypical presentation of ACS, therefore do feel that patient requires cardiac workup as well.  Chest x-ray ordered and reassuring, broad lab workup ordered, and does reveal demand ischemia, not consistent with STEMI or NSTEMI at this time.  No evidence of rhabdomyolysis.  Patient does have evidence of AKI with associated electrolyte derangements on exam.  Reevaluated patient and discussed presence of AKI.  Patient does admit to poor p.o. intake chronically over several months which she attributes to having a  poor appetite as she grows older.  Patient denies any SI, HI, depression.  Discussed that she has recurrent AKI as well as demand ischemia which require admission for further workup and treatment.  Patient states that she does not wish to be admitted to the hospital, is able to coherently describe why she presented to the emergency department, the diagnosis of AKI in layman's terms, as well as potential risks of leaving AMA including worsening AKI, progression to renal failure, possible need for dialysis, permanent disability, death.  Despite these risks, patient would like to leave AMA, therefore patient discharged AGAINST MEDICAL ADVICE and urged to re-present to the emergency department ASAP for continued care.  Presentation is most consistent with acute complicated illness and Presentation is most consistent with acute life/limb-threatening illness  Discussion of management or test interpretations with  external provider(s): Not indicated  Risk Drugs:Prescription drug management  Disposition: Discharge AMA  MDM generated using voice dictation software and may contain dictation errors.  Please contact me for any clarification or with any questions.  Clinical Impression:  1. AKI (acute kidney injury) (HCC)   2. Demand ischemia (HCC)   3. Malaise and fatigue      AMA   Final Clinical Impression(s) / ED Diagnoses Final diagnoses:  AKI (acute kidney injury) (HCC)  Demand ischemia (HCC)  Malaise and fatigue    Rx / DC Orders ED Discharge Orders     None        Rogelia Jerilynn RAMAN, MD 01/03/24 0102
